# Patient Record
Sex: Female | Born: 1945
Health system: Southern US, Community
[De-identification: ages and names within clinical notes are randomized; demographics above are authoritative.]

## PROBLEM LIST (undated history)

## (undated) DIAGNOSIS — E039 Hypothyroidism, unspecified: Secondary | ICD-10-CM

## (undated) DIAGNOSIS — R269 Unspecified abnormalities of gait and mobility: Secondary | ICD-10-CM

## (undated) DIAGNOSIS — F419 Anxiety disorder, unspecified: Secondary | ICD-10-CM

## (undated) DIAGNOSIS — N390 Urinary tract infection, site not specified: Secondary | ICD-10-CM

## (undated) DIAGNOSIS — I6529 Occlusion and stenosis of unspecified carotid artery: Secondary | ICD-10-CM

## (undated) DIAGNOSIS — I351 Nonrheumatic aortic (valve) insufficiency: Secondary | ICD-10-CM

## (undated) DIAGNOSIS — N952 Postmenopausal atrophic vaginitis: Secondary | ICD-10-CM

## (undated) DIAGNOSIS — L409 Psoriasis, unspecified: Secondary | ICD-10-CM

## (undated) DIAGNOSIS — R400 Somnolence: Secondary | ICD-10-CM

## (undated) DIAGNOSIS — I10 Essential (primary) hypertension: Secondary | ICD-10-CM

## (undated) DIAGNOSIS — E785 Hyperlipidemia, unspecified: Secondary | ICD-10-CM

## (undated) DIAGNOSIS — H9190 Unspecified hearing loss, unspecified ear: Secondary | ICD-10-CM

## (undated) DIAGNOSIS — J329 Chronic sinusitis, unspecified: Secondary | ICD-10-CM

## (undated) HISTORY — DX: Anxiety disorder, unspecified: F41.9

## (undated) HISTORY — DX: Chronic sinusitis, unspecified: J32.9

## (undated) HISTORY — DX: Hyperlipidemia, unspecified: E78.5

## (undated) HISTORY — DX: Hypothyroidism, unspecified: E03.9

## (undated) HISTORY — DX: Unspecified hearing loss, unspecified ear: H91.90

## (undated) HISTORY — DX: Occlusion and stenosis of unspecified carotid artery: I65.29

## (undated) HISTORY — DX: Unspecified abnormalities of gait and mobility: R26.9

## (undated) HISTORY — DX: Somnolence: R40.0

## (undated) HISTORY — DX: Urinary tract infection, site not specified: N39.0

## (undated) HISTORY — DX: Nonrheumatic aortic (valve) insufficiency: I35.1

## (undated) HISTORY — DX: Postmenopausal atrophic vaginitis: N95.2

## (undated) HISTORY — DX: Psoriasis, unspecified: L40.9

## (undated) HISTORY — DX: Essential (primary) hypertension: I10

## (undated) HISTORY — PX: TONSILLECTOMY: SUR1361

---

## 1991-11-06 HISTORY — PX: CHOLECYSTECTOMY: SHX55

## 2015-11-28 DIAGNOSIS — J019 Acute sinusitis, unspecified: Secondary | ICD-10-CM | POA: Diagnosis not present

## 2016-04-25 DIAGNOSIS — Z1231 Encounter for screening mammogram for malignant neoplasm of breast: Secondary | ICD-10-CM | POA: Diagnosis not present

## 2016-04-25 DIAGNOSIS — I10 Essential (primary) hypertension: Secondary | ICD-10-CM | POA: Diagnosis not present

## 2016-04-25 DIAGNOSIS — Z1389 Encounter for screening for other disorder: Secondary | ICD-10-CM | POA: Diagnosis not present

## 2016-04-25 DIAGNOSIS — Z130 Encounter for screening for diseases of the blood and blood-forming organs and certain disorders involving the immune mechanism: Secondary | ICD-10-CM | POA: Diagnosis not present

## 2016-04-25 DIAGNOSIS — E038 Other specified hypothyroidism: Secondary | ICD-10-CM | POA: Diagnosis not present

## 2016-04-25 DIAGNOSIS — Z0000 Encounter for general adult medical examination without abnormal findings: Secondary | ICD-10-CM | POA: Diagnosis not present

## 2016-04-25 DIAGNOSIS — Z6820 Body mass index (BMI) 20.0-20.9, adult: Secondary | ICD-10-CM | POA: Diagnosis not present

## 2016-04-25 DIAGNOSIS — Z1211 Encounter for screening for malignant neoplasm of colon: Secondary | ICD-10-CM | POA: Diagnosis not present

## 2016-04-25 DIAGNOSIS — E349 Endocrine disorder, unspecified: Secondary | ICD-10-CM | POA: Diagnosis not present

## 2016-04-25 DIAGNOSIS — Z79899 Other long term (current) drug therapy: Secondary | ICD-10-CM | POA: Diagnosis not present

## 2016-04-25 DIAGNOSIS — Z1212 Encounter for screening for malignant neoplasm of rectum: Secondary | ICD-10-CM | POA: Diagnosis not present

## 2016-04-25 DIAGNOSIS — E7800 Pure hypercholesterolemia, unspecified: Secondary | ICD-10-CM | POA: Diagnosis not present

## 2016-04-25 DIAGNOSIS — Z01419 Encounter for gynecological examination (general) (routine) without abnormal findings: Secondary | ICD-10-CM | POA: Diagnosis not present

## 2016-04-25 DIAGNOSIS — Z13820 Encounter for screening for osteoporosis: Secondary | ICD-10-CM | POA: Diagnosis not present

## 2016-04-25 DIAGNOSIS — Z124 Encounter for screening for malignant neoplasm of cervix: Secondary | ICD-10-CM | POA: Diagnosis not present

## 2016-04-25 DIAGNOSIS — Z789 Other specified health status: Secondary | ICD-10-CM | POA: Diagnosis not present

## 2016-05-01 ENCOUNTER — Other Ambulatory Visit (HOSPITAL_BASED_OUTPATIENT_CLINIC_OR_DEPARTMENT_OTHER): Payer: Self-pay | Admitting: Obstetrics and Gynecology

## 2016-05-01 DIAGNOSIS — Z780 Asymptomatic menopausal state: Secondary | ICD-10-CM

## 2016-05-01 DIAGNOSIS — Z1231 Encounter for screening mammogram for malignant neoplasm of breast: Secondary | ICD-10-CM

## 2016-05-03 ENCOUNTER — Ambulatory Visit (HOSPITAL_BASED_OUTPATIENT_CLINIC_OR_DEPARTMENT_OTHER)
Admission: RE | Admit: 2016-05-03 | Discharge: 2016-05-03 | Disposition: A | Discharge status: Home / Self Care | Payer: Medicare Other | Source: Ambulatory Visit | Attending: Obstetrics and Gynecology | Admitting: Obstetrics and Gynecology

## 2016-05-03 ENCOUNTER — Other Ambulatory Visit (HOSPITAL_BASED_OUTPATIENT_CLINIC_OR_DEPARTMENT_OTHER): Payer: Self-pay | Admitting: Obstetrics and Gynecology

## 2016-05-03 DIAGNOSIS — Z13820 Encounter for screening for osteoporosis: Secondary | ICD-10-CM

## 2016-05-03 DIAGNOSIS — Z780 Asymptomatic menopausal state: Secondary | ICD-10-CM | POA: Diagnosis not present

## 2016-05-03 DIAGNOSIS — M85851 Other specified disorders of bone density and structure, right thigh: Secondary | ICD-10-CM | POA: Insufficient documentation

## 2016-05-03 DIAGNOSIS — Z1231 Encounter for screening mammogram for malignant neoplasm of breast: Secondary | ICD-10-CM

## 2016-05-03 DIAGNOSIS — Z1239 Encounter for other screening for malignant neoplasm of breast: Secondary | ICD-10-CM | POA: Diagnosis not present

## 2016-05-03 IMAGING — MG MM DIGITAL SCREENING BILAT W/ TOMO W/ CAD
8 series · 9 of 24 positions shown · non-contrast
Comparison: Previous exam(s).

CLINICAL DATA: Screening.

EXAM:
2D DIGITAL SCREENING BILATERAL MAMMOGRAM WITH CAD AND ADJUNCT TOMO

[R CC]
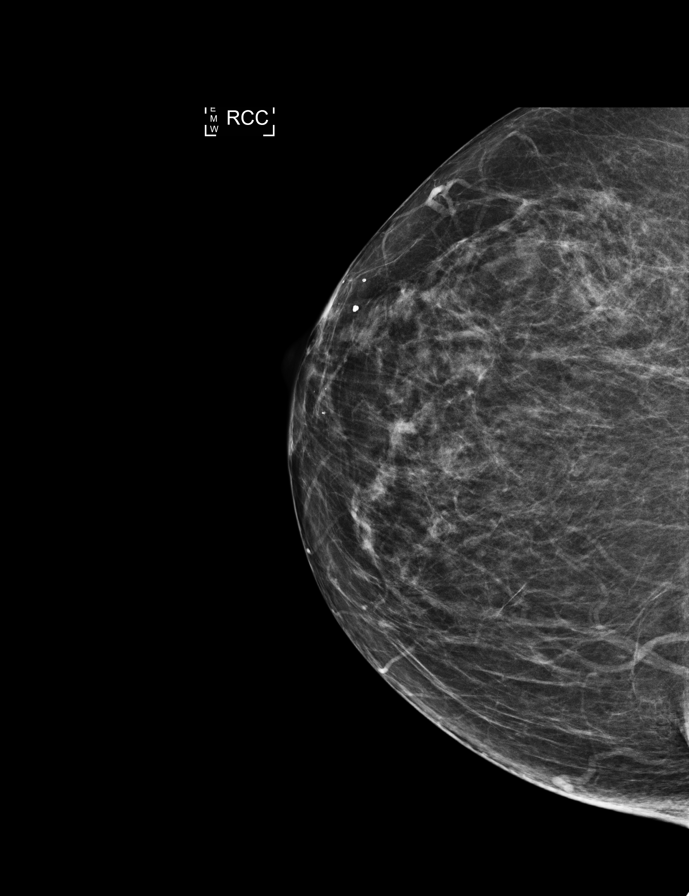

[R MLO]
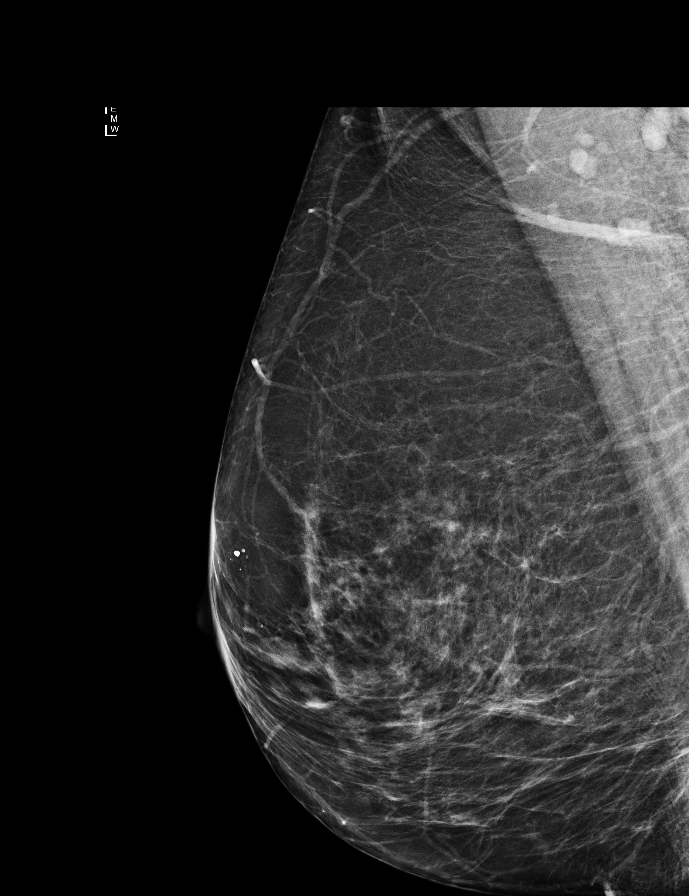

[L CC]
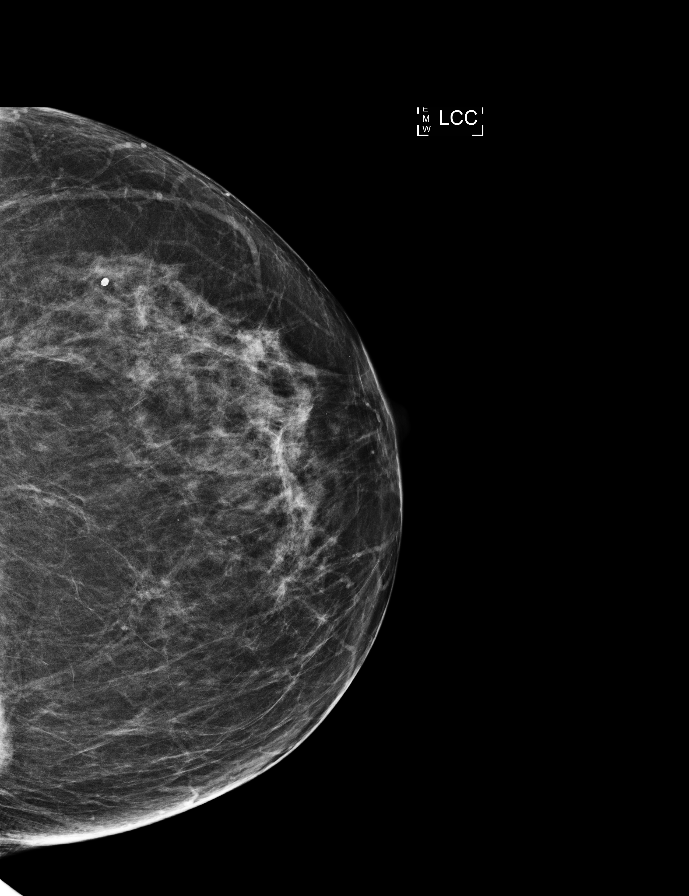

[L MLO]
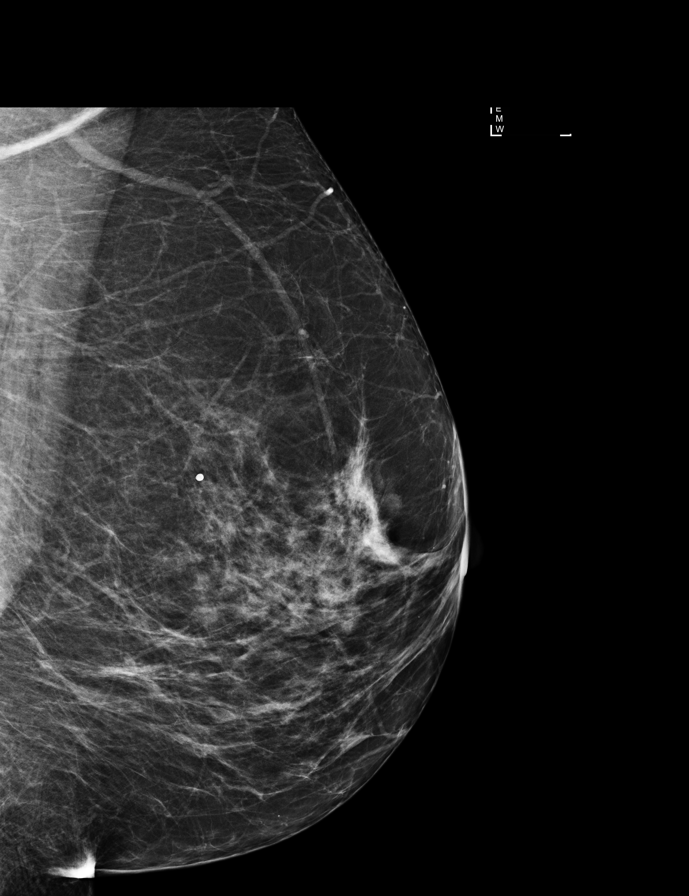

[L MLO tomo · 2 of 72 frames shown]
[frame 24/72]
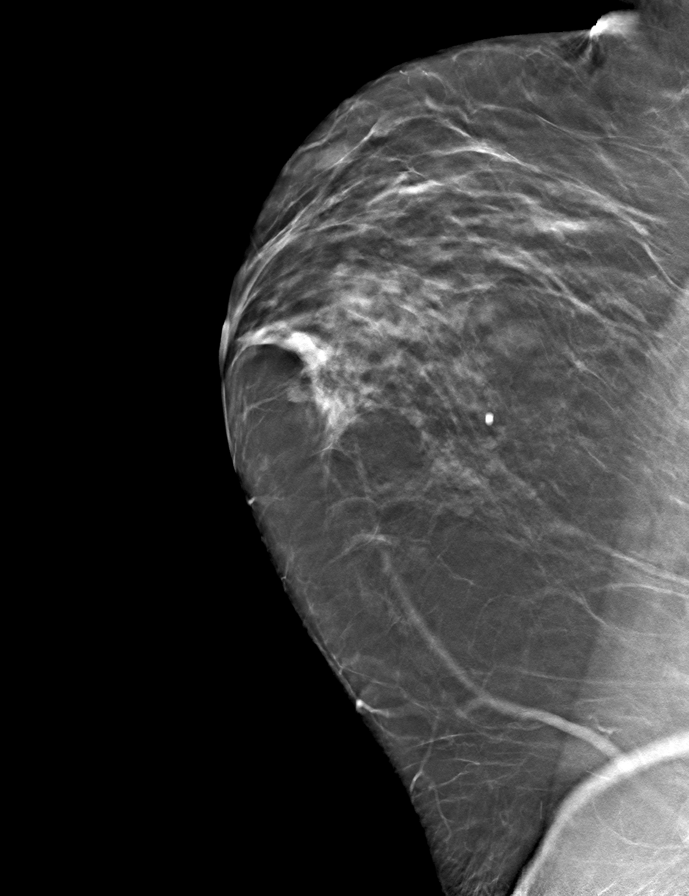
[frame 37/72]
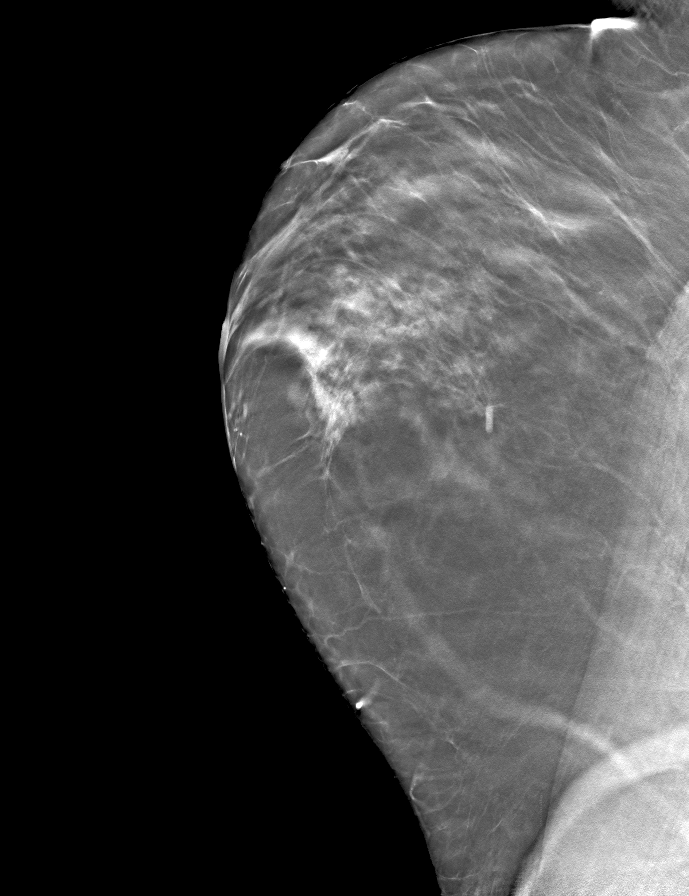

[L CC tomo · tomo slice 37/74.0]
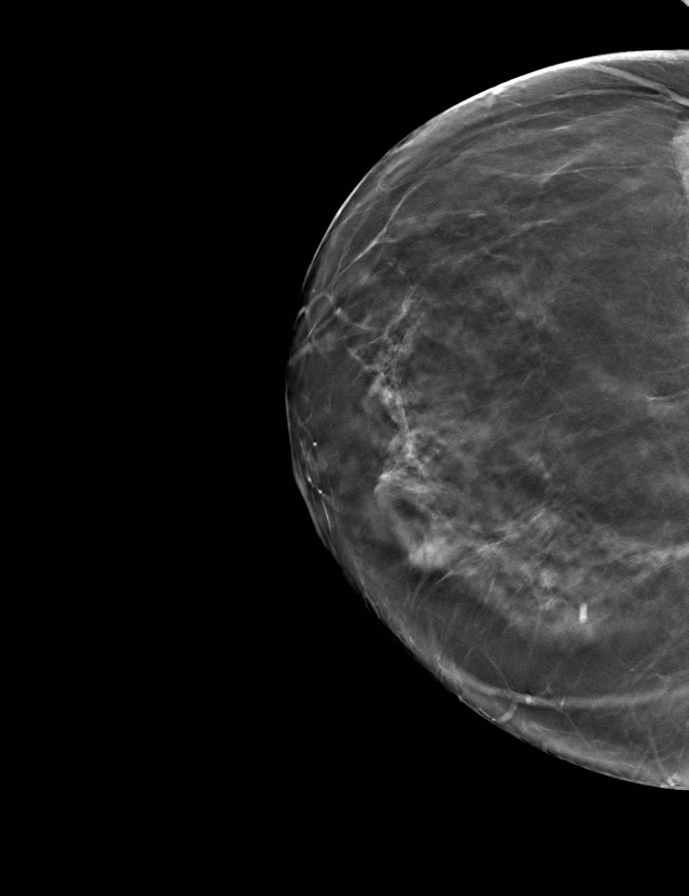

[R MLO tomo · tomo slice 37/73.0]
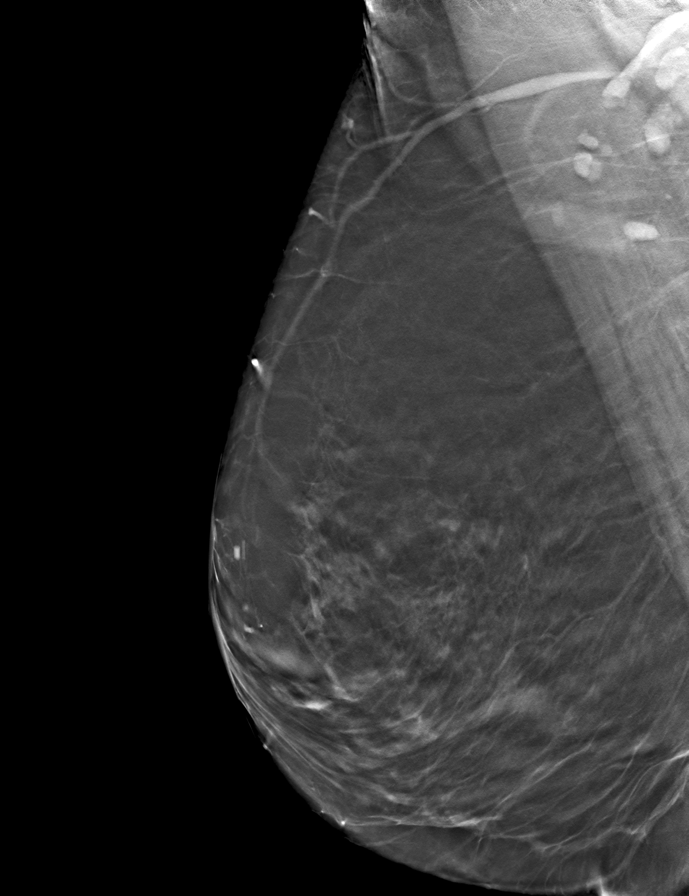

[R CC tomo · tomo slice 35/68.0]
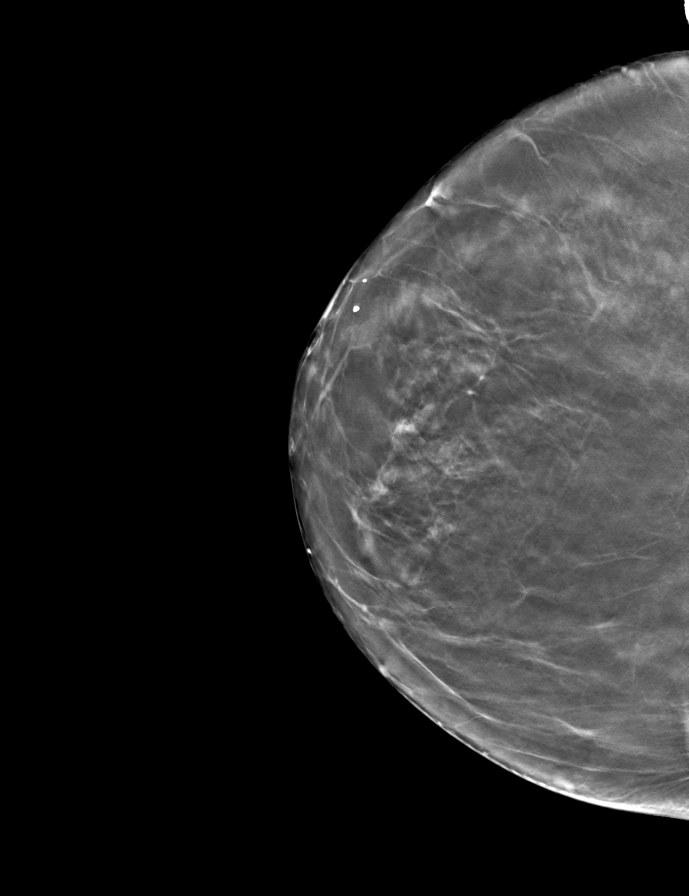

[9 of 24 positions shown; findings below may reference images not displayed]

ACR Breast Density Category b: There are scattered areas of
fibroglandular density.
FINDINGS: There are no findings suspicious for malignancy. Images were
processed with CAD.
IMPRESSION: No mammographic evidence of malignancy. A result letter of this
screening mammogram will be mailed directly to the patient.

RECOMMENDATION:
Screening mammogram in one year. (Code:[33])

BI-RADS CATEGORY  1: Negative.

## 2016-05-28 DIAGNOSIS — D6859 Other primary thrombophilia: Secondary | ICD-10-CM | POA: Diagnosis not present

## 2016-05-28 DIAGNOSIS — Z79899 Other long term (current) drug therapy: Secondary | ICD-10-CM | POA: Diagnosis not present

## 2016-05-28 DIAGNOSIS — N959 Unspecified menopausal and perimenopausal disorder: Secondary | ICD-10-CM | POA: Diagnosis not present

## 2016-05-28 DIAGNOSIS — E7800 Pure hypercholesterolemia, unspecified: Secondary | ICD-10-CM | POA: Diagnosis not present

## 2016-05-28 DIAGNOSIS — E038 Other specified hypothyroidism: Secondary | ICD-10-CM | POA: Diagnosis not present

## 2016-05-28 DIAGNOSIS — Z724 Inappropriate diet and eating habits: Secondary | ICD-10-CM | POA: Diagnosis not present

## 2016-05-28 DIAGNOSIS — E630 Essential fatty acid [EFA] deficiency: Secondary | ICD-10-CM | POA: Diagnosis not present

## 2016-05-28 DIAGNOSIS — Z789 Other specified health status: Secondary | ICD-10-CM | POA: Diagnosis not present

## 2016-05-28 DIAGNOSIS — R7301 Impaired fasting glucose: Secondary | ICD-10-CM | POA: Diagnosis not present

## 2016-05-28 DIAGNOSIS — Z6820 Body mass index (BMI) 20.0-20.9, adult: Secondary | ICD-10-CM | POA: Diagnosis not present

## 2016-05-31 DIAGNOSIS — R5382 Chronic fatigue, unspecified: Secondary | ICD-10-CM | POA: Diagnosis not present

## 2016-06-02 DIAGNOSIS — R1084 Generalized abdominal pain: Secondary | ICD-10-CM | POA: Diagnosis not present

## 2016-06-19 DIAGNOSIS — Z724 Inappropriate diet and eating habits: Secondary | ICD-10-CM | POA: Diagnosis not present

## 2016-06-19 DIAGNOSIS — Z6820 Body mass index (BMI) 20.0-20.9, adult: Secondary | ICD-10-CM | POA: Diagnosis not present

## 2016-06-19 DIAGNOSIS — E7800 Pure hypercholesterolemia, unspecified: Secondary | ICD-10-CM | POA: Diagnosis not present

## 2016-06-19 DIAGNOSIS — E630 Essential fatty acid [EFA] deficiency: Secondary | ICD-10-CM | POA: Diagnosis not present

## 2016-06-19 DIAGNOSIS — R7301 Impaired fasting glucose: Secondary | ICD-10-CM | POA: Diagnosis not present

## 2016-06-19 DIAGNOSIS — I6529 Occlusion and stenosis of unspecified carotid artery: Secondary | ICD-10-CM | POA: Diagnosis not present

## 2016-06-19 DIAGNOSIS — Z789 Other specified health status: Secondary | ICD-10-CM | POA: Diagnosis not present

## 2016-08-03 DIAGNOSIS — E630 Essential fatty acid [EFA] deficiency: Secondary | ICD-10-CM | POA: Diagnosis not present

## 2016-08-03 DIAGNOSIS — Z789 Other specified health status: Secondary | ICD-10-CM | POA: Diagnosis not present

## 2016-08-03 DIAGNOSIS — Z6820 Body mass index (BMI) 20.0-20.9, adult: Secondary | ICD-10-CM | POA: Diagnosis not present

## 2016-08-03 DIAGNOSIS — E7800 Pure hypercholesterolemia, unspecified: Secondary | ICD-10-CM | POA: Diagnosis not present

## 2016-08-03 DIAGNOSIS — R7301 Impaired fasting glucose: Secondary | ICD-10-CM | POA: Diagnosis not present

## 2016-08-03 DIAGNOSIS — Z724 Inappropriate diet and eating habits: Secondary | ICD-10-CM | POA: Diagnosis not present

## 2016-08-03 DIAGNOSIS — E230 Hypopituitarism: Secondary | ICD-10-CM | POA: Diagnosis not present

## 2016-08-03 DIAGNOSIS — B3782 Candidal enteritis: Secondary | ICD-10-CM | POA: Diagnosis not present

## 2016-08-16 DIAGNOSIS — Z23 Encounter for immunization: Secondary | ICD-10-CM | POA: Diagnosis not present

## 2016-09-10 DIAGNOSIS — F419 Anxiety disorder, unspecified: Secondary | ICD-10-CM | POA: Diagnosis not present

## 2016-09-10 DIAGNOSIS — J329 Chronic sinusitis, unspecified: Secondary | ICD-10-CM | POA: Diagnosis not present

## 2016-10-25 DIAGNOSIS — D6859 Other primary thrombophilia: Secondary | ICD-10-CM | POA: Diagnosis not present

## 2016-10-25 DIAGNOSIS — R109 Unspecified abdominal pain: Secondary | ICD-10-CM | POA: Diagnosis not present

## 2016-10-25 DIAGNOSIS — I10 Essential (primary) hypertension: Secondary | ICD-10-CM | POA: Diagnosis not present

## 2016-10-25 DIAGNOSIS — J209 Acute bronchitis, unspecified: Secondary | ICD-10-CM | POA: Diagnosis not present

## 2016-10-25 DIAGNOSIS — Z1389 Encounter for screening for other disorder: Secondary | ICD-10-CM | POA: Diagnosis not present

## 2016-10-25 DIAGNOSIS — Z6820 Body mass index (BMI) 20.0-20.9, adult: Secondary | ICD-10-CM | POA: Diagnosis not present

## 2016-10-25 DIAGNOSIS — R7301 Impaired fasting glucose: Secondary | ICD-10-CM | POA: Diagnosis not present

## 2016-10-25 DIAGNOSIS — K580 Irritable bowel syndrome with diarrhea: Secondary | ICD-10-CM | POA: Diagnosis not present

## 2016-10-25 DIAGNOSIS — E7800 Pure hypercholesterolemia, unspecified: Secondary | ICD-10-CM | POA: Diagnosis not present

## 2016-11-26 DIAGNOSIS — M545 Low back pain: Secondary | ICD-10-CM | POA: Diagnosis not present

## 2017-02-04 DIAGNOSIS — E7800 Pure hypercholesterolemia, unspecified: Secondary | ICD-10-CM | POA: Diagnosis not present

## 2017-02-04 DIAGNOSIS — Z6820 Body mass index (BMI) 20.0-20.9, adult: Secondary | ICD-10-CM | POA: Diagnosis not present

## 2017-02-04 DIAGNOSIS — I10 Essential (primary) hypertension: Secondary | ICD-10-CM | POA: Diagnosis not present

## 2017-04-29 DIAGNOSIS — H2513 Age-related nuclear cataract, bilateral: Secondary | ICD-10-CM | POA: Diagnosis not present

## 2017-04-29 DIAGNOSIS — L718 Other rosacea: Secondary | ICD-10-CM | POA: Diagnosis not present

## 2017-04-29 DIAGNOSIS — H16223 Keratoconjunctivitis sicca, not specified as Sjogren's, bilateral: Secondary | ICD-10-CM | POA: Diagnosis not present

## 2017-04-29 DIAGNOSIS — L719 Rosacea, unspecified: Secondary | ICD-10-CM | POA: Diagnosis not present

## 2017-05-06 DIAGNOSIS — H01021 Squamous blepharitis right upper eyelid: Secondary | ICD-10-CM | POA: Diagnosis not present

## 2017-05-06 DIAGNOSIS — H40013 Open angle with borderline findings, low risk, bilateral: Secondary | ICD-10-CM | POA: Diagnosis not present

## 2017-05-06 DIAGNOSIS — H2589 Other age-related cataract: Secondary | ICD-10-CM | POA: Diagnosis not present

## 2017-05-06 DIAGNOSIS — H01022 Squamous blepharitis right lower eyelid: Secondary | ICD-10-CM | POA: Diagnosis not present

## 2017-05-06 DIAGNOSIS — H2513 Age-related nuclear cataract, bilateral: Secondary | ICD-10-CM | POA: Diagnosis not present

## 2017-05-06 DIAGNOSIS — H01024 Squamous blepharitis left upper eyelid: Secondary | ICD-10-CM | POA: Diagnosis not present

## 2017-05-06 DIAGNOSIS — H04123 Dry eye syndrome of bilateral lacrimal glands: Secondary | ICD-10-CM | POA: Diagnosis not present

## 2017-05-06 DIAGNOSIS — H01025 Squamous blepharitis left lower eyelid: Secondary | ICD-10-CM | POA: Diagnosis not present

## 2017-05-14 DIAGNOSIS — E782 Mixed hyperlipidemia: Secondary | ICD-10-CM | POA: Diagnosis not present

## 2017-05-14 DIAGNOSIS — E038 Other specified hypothyroidism: Secondary | ICD-10-CM | POA: Diagnosis not present

## 2017-05-14 DIAGNOSIS — Z8249 Family history of ischemic heart disease and other diseases of the circulatory system: Secondary | ICD-10-CM | POA: Diagnosis not present

## 2017-05-14 DIAGNOSIS — R5383 Other fatigue: Secondary | ICD-10-CM | POA: Diagnosis not present

## 2017-05-14 DIAGNOSIS — E039 Hypothyroidism, unspecified: Secondary | ICD-10-CM | POA: Diagnosis not present

## 2017-05-14 DIAGNOSIS — E559 Vitamin D deficiency, unspecified: Secondary | ICD-10-CM | POA: Diagnosis not present

## 2017-05-14 DIAGNOSIS — E7211 Homocystinuria: Secondary | ICD-10-CM | POA: Diagnosis not present

## 2017-05-14 DIAGNOSIS — Z6824 Body mass index (BMI) 24.0-24.9, adult: Secondary | ICD-10-CM | POA: Diagnosis not present

## 2017-05-14 DIAGNOSIS — E7800 Pure hypercholesterolemia, unspecified: Secondary | ICD-10-CM | POA: Diagnosis not present

## 2017-05-14 DIAGNOSIS — I10 Essential (primary) hypertension: Secondary | ICD-10-CM | POA: Diagnosis not present

## 2017-05-20 DIAGNOSIS — H2512 Age-related nuclear cataract, left eye: Secondary | ICD-10-CM | POA: Diagnosis not present

## 2017-05-29 DIAGNOSIS — H2511 Age-related nuclear cataract, right eye: Secondary | ICD-10-CM | POA: Diagnosis not present

## 2017-06-03 DIAGNOSIS — H2512 Age-related nuclear cataract, left eye: Secondary | ICD-10-CM | POA: Diagnosis not present

## 2017-06-03 DIAGNOSIS — H2511 Age-related nuclear cataract, right eye: Secondary | ICD-10-CM | POA: Diagnosis not present

## 2017-07-07 DIAGNOSIS — Z23 Encounter for immunization: Secondary | ICD-10-CM | POA: Diagnosis not present

## 2017-07-12 DIAGNOSIS — E785 Hyperlipidemia, unspecified: Secondary | ICD-10-CM | POA: Diagnosis not present

## 2017-07-12 DIAGNOSIS — F419 Anxiety disorder, unspecified: Secondary | ICD-10-CM | POA: Diagnosis not present

## 2017-07-12 DIAGNOSIS — E039 Hypothyroidism, unspecified: Secondary | ICD-10-CM | POA: Diagnosis not present

## 2017-07-12 DIAGNOSIS — I10 Essential (primary) hypertension: Secondary | ICD-10-CM | POA: Diagnosis not present

## 2017-08-16 DIAGNOSIS — E785 Hyperlipidemia, unspecified: Secondary | ICD-10-CM | POA: Diagnosis not present

## 2017-08-16 DIAGNOSIS — F419 Anxiety disorder, unspecified: Secondary | ICD-10-CM | POA: Diagnosis not present

## 2017-08-16 DIAGNOSIS — E039 Hypothyroidism, unspecified: Secondary | ICD-10-CM | POA: Diagnosis not present

## 2017-08-16 DIAGNOSIS — I10 Essential (primary) hypertension: Secondary | ICD-10-CM | POA: Diagnosis not present

## 2017-08-26 DIAGNOSIS — L853 Xerosis cutis: Secondary | ICD-10-CM | POA: Diagnosis not present

## 2017-08-26 DIAGNOSIS — E039 Hypothyroidism, unspecified: Secondary | ICD-10-CM | POA: Diagnosis not present

## 2017-08-26 DIAGNOSIS — E785 Hyperlipidemia, unspecified: Secondary | ICD-10-CM | POA: Diagnosis not present

## 2017-08-26 DIAGNOSIS — I10 Essential (primary) hypertension: Secondary | ICD-10-CM | POA: Diagnosis not present

## 2017-10-11 DIAGNOSIS — E039 Hypothyroidism, unspecified: Secondary | ICD-10-CM | POA: Diagnosis not present

## 2017-10-16 DIAGNOSIS — I10 Essential (primary) hypertension: Secondary | ICD-10-CM | POA: Diagnosis not present

## 2017-10-16 DIAGNOSIS — E785 Hyperlipidemia, unspecified: Secondary | ICD-10-CM | POA: Diagnosis not present

## 2017-10-16 DIAGNOSIS — E039 Hypothyroidism, unspecified: Secondary | ICD-10-CM | POA: Diagnosis not present

## 2017-10-16 DIAGNOSIS — F419 Anxiety disorder, unspecified: Secondary | ICD-10-CM | POA: Diagnosis not present

## 2018-01-21 DIAGNOSIS — H9190 Unspecified hearing loss, unspecified ear: Secondary | ICD-10-CM | POA: Diagnosis not present

## 2018-01-21 DIAGNOSIS — R002 Palpitations: Secondary | ICD-10-CM | POA: Diagnosis not present

## 2018-01-21 DIAGNOSIS — F419 Anxiety disorder, unspecified: Secondary | ICD-10-CM | POA: Diagnosis not present

## 2018-01-21 DIAGNOSIS — I10 Essential (primary) hypertension: Secondary | ICD-10-CM | POA: Diagnosis not present

## 2018-01-21 DIAGNOSIS — E039 Hypothyroidism, unspecified: Secondary | ICD-10-CM | POA: Diagnosis not present

## 2018-01-21 DIAGNOSIS — Z13820 Encounter for screening for osteoporosis: Secondary | ICD-10-CM | POA: Diagnosis not present

## 2018-01-21 DIAGNOSIS — Z1211 Encounter for screening for malignant neoplasm of colon: Secondary | ICD-10-CM | POA: Diagnosis not present

## 2018-01-21 DIAGNOSIS — E785 Hyperlipidemia, unspecified: Secondary | ICD-10-CM | POA: Diagnosis not present

## 2018-01-21 DIAGNOSIS — Z0000 Encounter for general adult medical examination without abnormal findings: Secondary | ICD-10-CM | POA: Diagnosis not present

## 2018-01-25 DIAGNOSIS — Z1211 Encounter for screening for malignant neoplasm of colon: Secondary | ICD-10-CM | POA: Diagnosis not present

## 2018-02-03 DIAGNOSIS — M8589 Other specified disorders of bone density and structure, multiple sites: Secondary | ICD-10-CM | POA: Diagnosis not present

## 2018-02-03 DIAGNOSIS — Z780 Asymptomatic menopausal state: Secondary | ICD-10-CM | POA: Diagnosis not present

## 2018-02-03 DIAGNOSIS — Z1231 Encounter for screening mammogram for malignant neoplasm of breast: Secondary | ICD-10-CM | POA: Diagnosis not present

## 2018-02-10 DIAGNOSIS — E039 Hypothyroidism, unspecified: Secondary | ICD-10-CM | POA: Diagnosis not present

## 2018-02-10 DIAGNOSIS — R002 Palpitations: Secondary | ICD-10-CM | POA: Diagnosis not present

## 2018-02-10 DIAGNOSIS — E785 Hyperlipidemia, unspecified: Secondary | ICD-10-CM | POA: Diagnosis not present

## 2018-02-10 DIAGNOSIS — I10 Essential (primary) hypertension: Secondary | ICD-10-CM | POA: Diagnosis not present

## 2018-02-11 DIAGNOSIS — E785 Hyperlipidemia, unspecified: Secondary | ICD-10-CM | POA: Diagnosis not present

## 2018-02-11 DIAGNOSIS — R002 Palpitations: Secondary | ICD-10-CM | POA: Diagnosis not present

## 2018-02-11 DIAGNOSIS — E039 Hypothyroidism, unspecified: Secondary | ICD-10-CM | POA: Diagnosis not present

## 2018-02-11 DIAGNOSIS — I10 Essential (primary) hypertension: Secondary | ICD-10-CM | POA: Diagnosis not present

## 2018-02-14 DIAGNOSIS — I10 Essential (primary) hypertension: Secondary | ICD-10-CM | POA: Diagnosis not present

## 2018-03-13 DIAGNOSIS — E039 Hypothyroidism, unspecified: Secondary | ICD-10-CM | POA: Diagnosis not present

## 2018-03-13 DIAGNOSIS — E785 Hyperlipidemia, unspecified: Secondary | ICD-10-CM | POA: Diagnosis not present

## 2018-03-13 DIAGNOSIS — I10 Essential (primary) hypertension: Secondary | ICD-10-CM | POA: Diagnosis not present

## 2018-03-13 DIAGNOSIS — R002 Palpitations: Secondary | ICD-10-CM | POA: Diagnosis not present

## 2018-04-02 DIAGNOSIS — H903 Sensorineural hearing loss, bilateral: Secondary | ICD-10-CM | POA: Diagnosis not present

## 2018-05-05 DIAGNOSIS — M19071 Primary osteoarthritis, right ankle and foot: Secondary | ICD-10-CM | POA: Diagnosis not present

## 2018-05-05 DIAGNOSIS — G5761 Lesion of plantar nerve, right lower limb: Secondary | ICD-10-CM | POA: Diagnosis not present

## 2018-05-05 DIAGNOSIS — M79671 Pain in right foot: Secondary | ICD-10-CM | POA: Diagnosis not present

## 2018-05-05 DIAGNOSIS — M25571 Pain in right ankle and joints of right foot: Secondary | ICD-10-CM | POA: Diagnosis not present

## 2018-05-22 DIAGNOSIS — M25571 Pain in right ankle and joints of right foot: Secondary | ICD-10-CM | POA: Diagnosis not present

## 2018-05-22 DIAGNOSIS — G5761 Lesion of plantar nerve, right lower limb: Secondary | ICD-10-CM | POA: Diagnosis not present

## 2018-07-02 DIAGNOSIS — M25571 Pain in right ankle and joints of right foot: Secondary | ICD-10-CM | POA: Diagnosis not present

## 2018-07-02 DIAGNOSIS — G5761 Lesion of plantar nerve, right lower limb: Secondary | ICD-10-CM | POA: Diagnosis not present

## 2018-07-23 DIAGNOSIS — Z23 Encounter for immunization: Secondary | ICD-10-CM | POA: Diagnosis not present

## 2018-09-04 DIAGNOSIS — R8290 Unspecified abnormal findings in urine: Secondary | ICD-10-CM | POA: Diagnosis not present

## 2018-09-04 DIAGNOSIS — R400 Somnolence: Secondary | ICD-10-CM | POA: Diagnosis not present

## 2018-09-04 DIAGNOSIS — E785 Hyperlipidemia, unspecified: Secondary | ICD-10-CM | POA: Diagnosis not present

## 2018-09-04 DIAGNOSIS — L409 Psoriasis, unspecified: Secondary | ICD-10-CM | POA: Diagnosis not present

## 2018-09-04 DIAGNOSIS — F419 Anxiety disorder, unspecified: Secondary | ICD-10-CM | POA: Diagnosis not present

## 2018-09-04 DIAGNOSIS — R5383 Other fatigue: Secondary | ICD-10-CM | POA: Diagnosis not present

## 2018-09-04 DIAGNOSIS — R0602 Shortness of breath: Secondary | ICD-10-CM | POA: Diagnosis not present

## 2018-09-17 DIAGNOSIS — E785 Hyperlipidemia, unspecified: Secondary | ICD-10-CM | POA: Diagnosis not present

## 2018-09-17 DIAGNOSIS — Z6824 Body mass index (BMI) 24.0-24.9, adult: Secondary | ICD-10-CM | POA: Diagnosis not present

## 2018-09-17 DIAGNOSIS — E039 Hypothyroidism, unspecified: Secondary | ICD-10-CM | POA: Diagnosis not present

## 2018-09-17 DIAGNOSIS — I10 Essential (primary) hypertension: Secondary | ICD-10-CM | POA: Diagnosis not present

## 2018-10-22 DIAGNOSIS — E039 Hypothyroidism, unspecified: Secondary | ICD-10-CM | POA: Diagnosis not present

## 2018-11-04 DIAGNOSIS — E785 Hyperlipidemia, unspecified: Secondary | ICD-10-CM | POA: Diagnosis not present

## 2018-11-04 DIAGNOSIS — E039 Hypothyroidism, unspecified: Secondary | ICD-10-CM | POA: Diagnosis not present

## 2018-11-04 DIAGNOSIS — L659 Nonscarring hair loss, unspecified: Secondary | ICD-10-CM | POA: Diagnosis not present

## 2018-11-04 DIAGNOSIS — Z6824 Body mass index (BMI) 24.0-24.9, adult: Secondary | ICD-10-CM | POA: Diagnosis not present

## 2018-11-04 DIAGNOSIS — I10 Essential (primary) hypertension: Secondary | ICD-10-CM | POA: Diagnosis not present

## 2018-11-20 DIAGNOSIS — R351 Nocturia: Secondary | ICD-10-CM | POA: Diagnosis not present

## 2018-11-20 DIAGNOSIS — R350 Frequency of micturition: Secondary | ICD-10-CM | POA: Diagnosis not present

## 2018-11-20 DIAGNOSIS — N3946 Mixed incontinence: Secondary | ICD-10-CM | POA: Diagnosis not present

## 2018-11-25 DIAGNOSIS — I10 Essential (primary) hypertension: Secondary | ICD-10-CM | POA: Diagnosis not present

## 2018-11-26 DIAGNOSIS — N3020 Other chronic cystitis without hematuria: Secondary | ICD-10-CM | POA: Diagnosis not present

## 2018-11-26 DIAGNOSIS — N390 Urinary tract infection, site not specified: Secondary | ICD-10-CM | POA: Diagnosis not present

## 2018-11-26 DIAGNOSIS — B9620 Unspecified Escherichia coli [E. coli] as the cause of diseases classified elsewhere: Secondary | ICD-10-CM | POA: Diagnosis not present

## 2019-01-01 DIAGNOSIS — E039 Hypothyroidism, unspecified: Secondary | ICD-10-CM | POA: Diagnosis not present

## 2019-03-02 DIAGNOSIS — F419 Anxiety disorder, unspecified: Secondary | ICD-10-CM | POA: Diagnosis not present

## 2019-03-02 DIAGNOSIS — E039 Hypothyroidism, unspecified: Secondary | ICD-10-CM | POA: Diagnosis not present

## 2019-03-02 DIAGNOSIS — N952 Postmenopausal atrophic vaginitis: Secondary | ICD-10-CM | POA: Diagnosis not present

## 2019-03-02 DIAGNOSIS — I10 Essential (primary) hypertension: Secondary | ICD-10-CM | POA: Diagnosis not present

## 2019-03-02 DIAGNOSIS — E785 Hyperlipidemia, unspecified: Secondary | ICD-10-CM | POA: Diagnosis not present

## 2019-04-09 DIAGNOSIS — R5383 Other fatigue: Secondary | ICD-10-CM | POA: Diagnosis not present

## 2019-04-09 DIAGNOSIS — R269 Unspecified abnormalities of gait and mobility: Secondary | ICD-10-CM | POA: Diagnosis not present

## 2019-04-21 ENCOUNTER — Other Ambulatory Visit: Payer: Self-pay | Admitting: Family Medicine

## 2019-04-21 DIAGNOSIS — R269 Unspecified abnormalities of gait and mobility: Secondary | ICD-10-CM

## 2019-04-21 DIAGNOSIS — R413 Other amnesia: Secondary | ICD-10-CM

## 2019-05-05 ENCOUNTER — Encounter: Payer: Self-pay | Admitting: *Deleted

## 2019-05-05 DIAGNOSIS — E785 Hyperlipidemia, unspecified: Secondary | ICD-10-CM | POA: Diagnosis not present

## 2019-05-05 DIAGNOSIS — L659 Nonscarring hair loss, unspecified: Secondary | ICD-10-CM | POA: Diagnosis not present

## 2019-05-05 DIAGNOSIS — Z6824 Body mass index (BMI) 24.0-24.9, adult: Secondary | ICD-10-CM | POA: Diagnosis not present

## 2019-05-05 DIAGNOSIS — E039 Hypothyroidism, unspecified: Secondary | ICD-10-CM | POA: Diagnosis not present

## 2019-05-05 DIAGNOSIS — I10 Essential (primary) hypertension: Secondary | ICD-10-CM | POA: Diagnosis not present

## 2019-05-06 ENCOUNTER — Encounter: Payer: Self-pay | Admitting: Neurology

## 2019-05-06 ENCOUNTER — Ambulatory Visit (INDEPENDENT_AMBULATORY_CARE_PROVIDER_SITE_OTHER): Payer: Medicare Other | Admitting: Neurology

## 2019-05-06 ENCOUNTER — Other Ambulatory Visit: Payer: Self-pay

## 2019-05-06 ENCOUNTER — Telehealth: Payer: Self-pay | Admitting: Neurology

## 2019-05-06 VITALS — BP 150/80 | HR 72 | Temp 95.9°F | Ht 63.5 in | Wt 138.0 lb

## 2019-05-06 DIAGNOSIS — R5383 Other fatigue: Secondary | ICD-10-CM | POA: Diagnosis not present

## 2019-05-06 DIAGNOSIS — Z79899 Other long term (current) drug therapy: Secondary | ICD-10-CM | POA: Diagnosis not present

## 2019-05-06 DIAGNOSIS — R4189 Other symptoms and signs involving cognitive functions and awareness: Secondary | ICD-10-CM | POA: Diagnosis not present

## 2019-05-06 DIAGNOSIS — E531 Pyridoxine deficiency: Secondary | ICD-10-CM

## 2019-05-06 DIAGNOSIS — Z114 Encounter for screening for human immunodeficiency virus [HIV]: Secondary | ICD-10-CM

## 2019-05-06 DIAGNOSIS — R4701 Aphasia: Secondary | ICD-10-CM

## 2019-05-06 DIAGNOSIS — E519 Thiamine deficiency, unspecified: Secondary | ICD-10-CM | POA: Diagnosis not present

## 2019-05-06 DIAGNOSIS — G9349 Other encephalopathy: Secondary | ICD-10-CM | POA: Diagnosis not present

## 2019-05-06 DIAGNOSIS — R413 Other amnesia: Secondary | ICD-10-CM

## 2019-05-06 DIAGNOSIS — B999 Unspecified infectious disease: Secondary | ICD-10-CM

## 2019-05-06 DIAGNOSIS — E538 Deficiency of other specified B group vitamins: Secondary | ICD-10-CM | POA: Diagnosis not present

## 2019-05-06 DIAGNOSIS — R410 Disorientation, unspecified: Secondary | ICD-10-CM

## 2019-05-06 DIAGNOSIS — E539 Vitamin B deficiency, unspecified: Secondary | ICD-10-CM | POA: Diagnosis not present

## 2019-05-06 DIAGNOSIS — R799 Abnormal finding of blood chemistry, unspecified: Secondary | ICD-10-CM | POA: Diagnosis not present
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
## 2019-05-06 NOTE — Patient Instructions (Addendum)
MRI of the brain w/wo contrast Labwork  Formal memory testing  Memory Compensation Strategies  1. Use "WARM" strategy.  W= write it down  A= associate it  R= repeat it  M= make a mental note  2.   You can keep a Social worker.  Use a 3-ring notebook with sections for the following: calendar, important names and phone numbers,  medications, doctors' names/phone numbers, lists/reminders, and a section to journal what you did  each day.   3.    Use a calendar to write appointments down.  4.    Write yourself a schedule for the day.  This can be placed on the calendar or in a separate section of the Memory Notebook.  Keeping a  regular schedule can help memory.  5.    Use medication organizer with sections for each day or morning/evening pills.  You may need help loading it  6.    Keep a basket, or pegboard by the door.  Place items that you need to take out with you in the basket or on the pegboard.  You may also want to  include a message board for reminders.  7.    Use sticky notes.  Place sticky notes with reminders in a place where the task is performed.  For example: " turn off the  stove" placed by the stove, "lock the door" placed on the door at eye level, " take your medications" on  the bathroom mirror or by the place where you normally take your medications.  8.    Use alarms/timers.  Use while cooking to remind yourself to check on food or as a reminder to take your medicine, or as a  reminder to make a call, or as a reminder to perform another task, etc.

## 2019-05-06 NOTE — Telephone Encounter (Signed)
[place]ic[place] order sent to [facility]. No auth they will reach out to the patient to schedule.  

## 2019-05-10 LAB — CBC
Hematocrit: 41.9 % (ref 34.0–46.6)
Hemoglobin: 14.7 g/dL (ref 11.1–15.9)
MCH: 31.3 pg (ref 26.6–33.0)
MCHC: 35.1 g/dL (ref 31.5–35.7)
MCV: 89 fL (ref 79–97)
Platelets: 266 10*3/uL (ref 150–450)
RBC: 4.69 x10E6/uL (ref 3.77–5.28)
RDW: 13.4 % (ref 11.7–15.4)
WBC: 9.9 10*3/uL (ref 3.4–10.8)

## 2019-05-10 LAB — COMPREHENSIVE METABOLIC PANEL
ALT: 29 IU/L (ref 0–32)
AST: 38 IU/L (ref 0–40)
Albumin/Globulin Ratio: 1.8 (ref 1.2–2.2)
Albumin: 4.5 g/dL (ref 3.7–4.7)
Alkaline Phosphatase: 99 IU/L (ref 39–117)
BUN/Creatinine Ratio: 28 (ref 12–28)
BUN: 23 mg/dL (ref 8–27)
Bilirubin Total: 0.9 mg/dL (ref 0.0–1.2)
CO2: 21 mmol/L (ref 20–29)
Calcium: 9.7 mg/dL (ref 8.7–10.3)
Chloride: 103 mmol/L (ref 96–106)
Creatinine, Ser: 0.81 mg/dL (ref 0.57–1.00)
GFR calc Af Amer: 85 mL/min/{1.73_m2} (ref >59)
GFR calc non Af Amer: 73 mL/min/{1.73_m2} (ref >59)
Globulin, Total: 2.5 g/dL (ref 1.5–4.5)
Glucose: 97 mg/dL (ref 65–99)
Potassium: 4.2 mmol/L (ref 3.5–5.2)
Sodium: 139 mmol/L (ref 134–144)
Total Protein: 7 g/dL (ref 6.0–8.5)

## 2019-05-10 LAB — METHYLMALONIC ACID, SERUM: Methylmalonic Acid: 204 nmol/L (ref 0–378)

## 2019-05-10 LAB — RPR: RPR Ser Ql: NONREACTIVE

## 2019-05-10 LAB — HIV ANTIBODY (ROUTINE TESTING W REFLEX): HIV Screen 4th Generation wRfx: NONREACTIVE

## 2019-05-10 LAB — VITAMIN B6: Vitamin B6: 37 ug/L — ABNORMAL HIGH (ref 2.0–32.8)

## 2019-05-10 LAB — VITAMIN B1: Thiamine: 187.3 nmol/L (ref 66.5–200.0)

## 2019-05-10 LAB — B12 AND FOLATE PANEL
Folate: >20 ng/mL (ref >3.0)
Vitamin B-12: 537 pg/mL (ref 232–1245)

## 2019-05-10 LAB — HOMOCYSTEINE: Homocysteine: 16 umol/L (ref 0.0–19.2)

## 2019-05-13 ENCOUNTER — Telehealth: Payer: Self-pay | Admitting: Neurology

## 2019-05-13 NOTE — Telephone Encounter (Signed)
-----   Message from Melvenia Beam, MD sent at 05/12/2019  7:06 PM EDT ----- Labs normal thanks

## 2019-05-13 NOTE — Telephone Encounter (Signed)
Called the pt and made her aware of the normal lab findings. Pt verbalized understanding. Pt had no questions at this time but was encouraged to call back if questions arise.

## 2019-05-15 DIAGNOSIS — N3946 Mixed incontinence: Secondary | ICD-10-CM | POA: Diagnosis not present

## 2019-05-15 DIAGNOSIS — R350 Frequency of micturition: Secondary | ICD-10-CM | POA: Diagnosis not present

## 2019-05-27 ENCOUNTER — Other Ambulatory Visit: Payer: Medicare Other

## 2019-05-29 DIAGNOSIS — E039 Hypothyroidism, unspecified: Secondary | ICD-10-CM | POA: Diagnosis not present

## 2019-06-02 DIAGNOSIS — I10 Essential (primary) hypertension: Secondary | ICD-10-CM | POA: Diagnosis not present

## 2019-06-02 DIAGNOSIS — L659 Nonscarring hair loss, unspecified: Secondary | ICD-10-CM | POA: Diagnosis not present

## 2019-06-02 DIAGNOSIS — E785 Hyperlipidemia, unspecified: Secondary | ICD-10-CM | POA: Diagnosis not present

## 2019-06-02 DIAGNOSIS — Z6824 Body mass index (BMI) 24.0-24.9, adult: Secondary | ICD-10-CM | POA: Diagnosis not present

## 2019-06-02 DIAGNOSIS — E039 Hypothyroidism, unspecified: Secondary | ICD-10-CM | POA: Diagnosis not present

## 2019-06-03 ENCOUNTER — Ambulatory Visit
Admission: RE | Admit: 2019-06-03 | Discharge: 2019-06-03 | Disposition: A | Discharge status: Home / Self Care | Payer: Medicare Other | Source: Ambulatory Visit | Attending: Neurology | Admitting: Neurology

## 2019-06-03 ENCOUNTER — Other Ambulatory Visit: Payer: Self-pay

## 2019-06-03 DIAGNOSIS — R4701 Aphasia: Secondary | ICD-10-CM | POA: Diagnosis not present

## 2019-06-03 DIAGNOSIS — R410 Disorientation, unspecified: Secondary | ICD-10-CM

## 2019-06-03 DIAGNOSIS — R413 Other amnesia: Secondary | ICD-10-CM

## 2019-06-03 MED ORDER — GADOBENATE DIMEGLUMINE 529 MG/ML IV SOLN
14.0000 mL | Freq: Once | INTRAVENOUS | Status: AC | PRN
  Administered 2019-06-03: 14 mL via INTRAVENOUS

## 2019-06-08 ENCOUNTER — Telehealth: Payer: Self-pay | Admitting: *Deleted

## 2019-06-08 NOTE — Telephone Encounter (Signed)
-----   Message from Melvenia Beam, MD sent at 06/05/2019  9:49 AM EDT ----- Brain normal for age, thanks

## 2019-06-08 NOTE — Telephone Encounter (Signed)
Spoke with pt and discussed MRI brain results being normal for age. Her questions were answered. She verbalized appreciation for the call.

## 2019-07-08 DIAGNOSIS — Z23 Encounter for immunization: Secondary | ICD-10-CM | POA: Diagnosis not present

## 2019-07-17 DIAGNOSIS — E039 Hypothyroidism, unspecified: Secondary | ICD-10-CM | POA: Diagnosis not present

## 2019-07-17 DIAGNOSIS — L659 Nonscarring hair loss, unspecified: Secondary | ICD-10-CM | POA: Diagnosis not present

## 2019-07-27 DIAGNOSIS — E039 Hypothyroidism, unspecified: Secondary | ICD-10-CM | POA: Diagnosis not present

## 2019-07-27 DIAGNOSIS — L659 Nonscarring hair loss, unspecified: Secondary | ICD-10-CM | POA: Diagnosis not present

## 2019-07-27 DIAGNOSIS — Z6824 Body mass index (BMI) 24.0-24.9, adult: Secondary | ICD-10-CM | POA: Diagnosis not present

## 2019-07-27 DIAGNOSIS — E785 Hyperlipidemia, unspecified: Secondary | ICD-10-CM | POA: Diagnosis not present

## 2019-07-27 DIAGNOSIS — I10 Essential (primary) hypertension: Secondary | ICD-10-CM | POA: Diagnosis not present

## 2019-08-11 ENCOUNTER — Encounter: Payer: Self-pay | Admitting: Psychology

## 2019-08-11 ENCOUNTER — Other Ambulatory Visit: Payer: Self-pay

## 2019-08-11 ENCOUNTER — Encounter: Payer: Medicare Other | Attending: Psychology | Admitting: Psychology

## 2019-08-11 DIAGNOSIS — R413 Other amnesia: Secondary | ICD-10-CM | POA: Insufficient documentation

## 2019-08-11 DIAGNOSIS — G4759 Other parasomnia: Secondary | ICD-10-CM | POA: Diagnosis not present

## 2019-08-11 DIAGNOSIS — F22 Delusional disorders: Secondary | ICD-10-CM | POA: Insufficient documentation

## 2019-08-11 DIAGNOSIS — F600 Paranoid personality disorder: Secondary | ICD-10-CM
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
## 2019-08-11 NOTE — Progress Notes (Signed)
Made any corrections needed, and agree with history, physical, neuro exam,assessment and plan as stated.     Antonia Ahern, MD [facility]  

## 2019-08-24 ENCOUNTER — Encounter: Payer: Self-pay | Admitting: Psychology

## 2019-08-24 ENCOUNTER — Other Ambulatory Visit: Payer: Self-pay

## 2019-08-24 ENCOUNTER — Encounter: Payer: Medicare Other | Admitting: Psychology

## 2019-08-24 DIAGNOSIS — F22 Delusional disorders: Secondary | ICD-10-CM | POA: Diagnosis not present

## 2019-08-24 DIAGNOSIS — R413 Other amnesia: Secondary | ICD-10-CM | POA: Diagnosis not present

## 2019-08-24 DIAGNOSIS — F600 Paranoid personality disorder: Secondary | ICD-10-CM | POA: Diagnosis not present

## 2019-08-24 DIAGNOSIS — G4759 Other parasomnia: Secondary | ICD-10-CM | POA: Diagnosis not present

## 2019-08-24 NOTE — Progress Notes (Signed)
The patient arrived on time to her 8:00 testing appointment. The evaluation lasted 240 minutes.  Mental Status & Behavioral Observations:  Appearance: Casually dressed, appropriately groomed.  Gait: Ambulated independently without difficulty. Speech:  Clear, normal rate, tone, & volume; Mild WFD Noted Thought process: Disorganized, tangential, and perseverative at times. Significantly abnormal with paranoid and delusional ideation noted.   Mood & Affect: Anxious with broad affect Interpersonal: Mostly pleasant, somewhat disinhibited and inappropriate at times.  Orientation: o x 4   Tests Administered:  Modified [employer] (M-WCST)   Trail Making Test (A&B)  Wechsler Adult Intelligence Scale-Fourth Edition (WAIS-IV)  Wechsler Memory Scale-Fourth Edition (WMS-IV) Older Adult Version (ages 74-90)  She had no difficulty hearing, seeing, or understanding most test items and did not require much additional prompting. She exhibited adequate distress tolerance on questions she did not know or tasks that were more difficult. She became frustrated during a measure of executive function (e.g. problem solving, cognitive flexibility, etc.) that required her to shift strategies in response to changing environmental contingencies. Overall, she was cooperative with all assigned tasks and persisted well throughout the evaluation.    Results:   WAIS-IV  Composite Score Summary  Scale Sum of Scaled Scores Composite Score Percentile Rank 95% Conf. Interval  Verbal Comprehension 31 VCI 102 55 96-108  Perceptual Reasoning 30 PRI 100 50 94-106  Working Memory 23 WMI 108 70 101-114  Processing Speed 21 PSI 102 55 93-110  Full Scale 105 FSIQ 103 58 99-107  General Ability 61 GAI 101 53 96-106   Index Level Discrepancy Comparisons  Comparison Score 1 Score 2 Difference Critical Value .05 Significant Difference Y/N Base Rate by Overall Sample  VCI - PRI 102 100 2 8.31 N 44.7  VCI  - WMI 102 108 -6 9.30 N 33.0  VCI - PSI 102 102 0 10.19 N   PRI - WMI 100 108 -8 10.18 N 28.9  PRI - PSI 100 102 -2 11.00 N 46.8  WMI - PSI 108 102 6 11.76 N 34.0  FSIQ - GAI 103 101 2 3.61 N 38.4   WMS-IV (Older Adult Battery) Index Score Summary  Index Sum of Scaled Scores Index Score Percentile Rank 95% Confidence Interval Qualitative Descriptor  Auditory Memory (AMI) 55 123 94 116-128 Superior  Visual Memory (VMI) 26 116 86 111-120 High Average  Immediate Memory (IMI) 39 120 91 113-125 Superior  Delayed Memory (DMI) 42 127 96 117-132 Superior   Primary Subtest Scaled Score Summary  Subtest Domain Raw Score Scaled Score Percentile Rank  Logical Memory I AM 41 14 91  Logical Memory II AM 27 14 91  Verbal Paired Associates I AM 30 14 91  Verbal [facility] Associates II AM 8 13 84  Visual Reproduction I VM 32 11 63  Visual Reproduction II VM 32 15 95  Symbol Span VWM 19 11 63   Auditory Memory Process Score Summary  Process Score Raw Score Scaled Score Percentile Rank Cumulative Percentage (Base Rate)  LM II Recognition 21 - - >75%  VPA II Recognition 30 - - >75%   Visual Memory Process Score Summary  Process Score Raw Score Scaled Score Percentile Rank Cumulative Percentage (Base Rate)  VR II Recognition 6 - - >75%    ABILITY-MEMORY ANALYSIS  Ability Score:  GAI: 101 Date of Testing:  WAIS-IV; WMS-IV 2019/08/24  Predicted Difference Method   Index Predicted WMS-IV Index Score Actual WMS-IV Index Score Difference Critical Value  Significant Difference Y/N  Base Rate  Auditory Memory 101 123 -22 10.41 Y 4-5%  Visual Memory 101 116 -15 7.89 Y 10-15%  Immediate Memory 101 120 -19 9.97 Y 5%  Delayed Memory 101 127 -26 12.33 Y 2%  Statistical significance (critical value) at the .01 level.   Contrast Scaled Scores  Score Score 1 Score 2 Contrast Scaled Score  General Ability Index vs. Auditory Memory Index 101 123 15  General Ability Index vs. Visual Memory Index  101 116 14  General Ability Index vs. Immediate Memory Index 101 120 15  General Ability Index vs. Delayed Memory Index 101 127 17  Verbal Comprehension Index vs. Auditory Memory Index 102 123 15  Perceptual Reasoning Index vs. Visual Memory Index 100 116 13  Working Memory Index vs. Auditory Memory Index 108 123 15    Trail Making Test: . Part A (Time=31", Errors=0, T=53, 62nd %) . Part B (Time=197", Errors=2, T=33, 5th %)  M-WCST:  . # Categories Correct (Raw=2, T=28, 1st %) . # Perseverative Errors (Raw=15, T=30, 2nd %) . # Total Errors  (Raw=29, T=30, 2nd %) . % Perseverative Responses (52%, T=38, 12th %) . Executive Function Composite (SS=49, <1st %)

## 2019-09-17 DIAGNOSIS — Z0000 Encounter for general adult medical examination without abnormal findings: Secondary | ICD-10-CM | POA: Diagnosis not present

## 2019-09-17 DIAGNOSIS — I10 Essential (primary) hypertension: Secondary | ICD-10-CM | POA: Diagnosis not present

## 2019-09-17 DIAGNOSIS — Z1211 Encounter for screening for malignant neoplasm of colon: Secondary | ICD-10-CM | POA: Diagnosis not present

## 2019-09-17 DIAGNOSIS — M79604 Pain in right leg: Secondary | ICD-10-CM | POA: Diagnosis not present

## 2019-09-17 DIAGNOSIS — M79605 Pain in left leg: Secondary | ICD-10-CM | POA: Diagnosis not present

## 2019-09-17 DIAGNOSIS — Z1159 Encounter for screening for other viral diseases: Secondary | ICD-10-CM | POA: Diagnosis not present

## 2019-09-17 DIAGNOSIS — E039 Hypothyroidism, unspecified: Secondary | ICD-10-CM | POA: Diagnosis not present

## 2019-09-17 DIAGNOSIS — Z124 Encounter for screening for malignant neoplasm of cervix: Secondary | ICD-10-CM | POA: Diagnosis not present

## 2019-09-17 DIAGNOSIS — E785 Hyperlipidemia, unspecified: Secondary | ICD-10-CM | POA: Diagnosis not present

## 2019-09-17 DIAGNOSIS — F419 Anxiety disorder, unspecified: Secondary | ICD-10-CM | POA: Diagnosis not present

## 2019-09-23 DIAGNOSIS — Z124 Encounter for screening for malignant neoplasm of cervix: Secondary | ICD-10-CM | POA: Diagnosis not present

## 2019-09-23 DIAGNOSIS — N390 Urinary tract infection, site not specified: Secondary | ICD-10-CM | POA: Diagnosis not present

## 2019-09-23 DIAGNOSIS — Z6824 Body mass index (BMI) 24.0-24.9, adult: Secondary | ICD-10-CM | POA: Diagnosis not present

## 2019-09-24 DIAGNOSIS — E039 Hypothyroidism, unspecified: Secondary | ICD-10-CM | POA: Diagnosis not present

## 2019-09-28 DIAGNOSIS — E785 Hyperlipidemia, unspecified: Secondary | ICD-10-CM | POA: Diagnosis not present

## 2019-09-28 DIAGNOSIS — E039 Hypothyroidism, unspecified: Secondary | ICD-10-CM | POA: Diagnosis not present

## 2019-09-28 DIAGNOSIS — I10 Essential (primary) hypertension: Secondary | ICD-10-CM | POA: Diagnosis not present

## 2019-09-28 DIAGNOSIS — L659 Nonscarring hair loss, unspecified: Secondary | ICD-10-CM | POA: Diagnosis not present

## 2019-09-28 DIAGNOSIS — Z6824 Body mass index (BMI) 24.0-24.9, adult: Secondary | ICD-10-CM | POA: Diagnosis not present

## 2019-10-14 DIAGNOSIS — E039 Hypothyroidism, unspecified: Secondary | ICD-10-CM | POA: Diagnosis not present

## 2019-10-16 DIAGNOSIS — Z1159 Encounter for screening for other viral diseases: Secondary | ICD-10-CM | POA: Diagnosis not present

## 2019-10-22 ENCOUNTER — Other Ambulatory Visit: Payer: Self-pay

## 2019-10-22 DIAGNOSIS — I739 Peripheral vascular disease, unspecified: Secondary | ICD-10-CM

## 2019-10-23 ENCOUNTER — Ambulatory Visit (HOSPITAL_COMMUNITY)
Admission: RE | Admit: 2019-10-23 | Discharge: 2019-10-23 | Disposition: A | Discharge status: Home / Self Care | Payer: Medicare Other | Source: Ambulatory Visit | Attending: Vascular Surgery | Admitting: Vascular Surgery

## 2019-10-23 ENCOUNTER — Encounter: Payer: Self-pay | Admitting: Vascular Surgery

## 2019-10-23 ENCOUNTER — Ambulatory Visit (INDEPENDENT_AMBULATORY_CARE_PROVIDER_SITE_OTHER): Payer: Medicare Other | Admitting: Vascular Surgery

## 2019-10-23 ENCOUNTER — Other Ambulatory Visit: Payer: Self-pay

## 2019-10-23 VITALS — BP 118/74 | HR 76 | Temp 97.6°F | Resp 20 | Ht 63.5 in | Wt 143.0 lb

## 2019-10-23 DIAGNOSIS — I739 Peripheral vascular disease, unspecified: Secondary | ICD-10-CM

## 2019-10-23 NOTE — Progress Notes (Signed)
Patient ID: Kristina Swanson, female   DOB: 1946-08-02, 73 y.o.   MRN: [id number]  Reason for Consult: New Patient (Initial Visit)   Referred by Marda Stalker, PA-C  Subjective:     HPI:  Kristina Swanson is a 73 y.o. very pleasant female originally from [place] now living here in [facility].  She has psoriasis which she has dealt with for many years.  She does have bilateral lower extremity mostly weakness.  She states that she has to sit down to put her clothes on given her lack of balance.  She also has developed a tremor in her bilateral upper extremities left greater than right.  She is able to walk does have some weakness in her legs.  She has never had stroke TIA or amaurosis does states she was told she had blocked carotid arteries in the past.  She otherwise does not have any coronary disease.  Other than psoriasis has no wounds on her legs.  Risk factors for vascular disease include hyperlipidemia and hypertension.  She takes a statin does not take any aspirin.  Past Medical History:  Diagnosis Date  . Anxiety   . Aortic insufficiency    mild/mod insufficiency/sclerosis shown on 2013 echo  . Atrophic vaginitis   . Carotid artery stenosis   . Daytime somnolence   . Gait disturbance   . HTN (hypertension), benign   . Hyperlipidemia   . Hypothyroidism   . Perceived hearing changes   . Psoriasis   . Recurrent urinary tract infection    [facility]  . Sinusitis    Family History  Problem Relation Age of Onset  . Memory loss Mother   . Congestive Heart Failure Mother   . Alzheimer's disease Maternal Aunt        in 18s  . Alzheimer's disease Maternal Uncle        in 87s  . Leukemia Father    Past Surgical History:  Procedure Laterality Date  . CHOLECYSTECTOMY  1993  . TONSILLECTOMY     as a child    Short Social History:  Social History   Tobacco Use  . Smoking status: Never Smoker  . Smokeless tobacco: Never Used  Substance Use  Topics  . Alcohol use: Yes    Alcohol/week: 14.0 standard drinks    Types: 14 Glasses of wine per week    Comment: wine     Allergies  Allergen Reactions  . Codeine Hives    Current Outpatient Medications  Medication Sig Dispense Refill  . ALPRAZolam (XANAX) 1 MG tablet Take 0.5 mg by mouth 2 (two) times daily as needed for anxiety.    . Ascorbic Acid (VITAMIN C PO) Take by mouth.    . B Complex Vitamins (VITAMIN B COMPLEX PO) Take by mouth.    . clobetasol cream (TEMOVATE) 9.67 % Apply 1 application topically 2 (two) times daily as needed (psoriasis).    . pravastatin (PRAVACHOL) 40 MG tablet Take 40 mg by mouth 3 (three) times a week.    Marland Kitchen PRESCRIPTION MEDICATION Omega 3    . spironolactone (ALDACTONE) 25 MG tablet Take 25 mg by mouth 2 (two) times daily.     Marland Kitchen SYNTHROID 75 MCG tablet     . valsartan-hydrochlorothiazide (DIOVAN-HCT) 160-25 MG tablet Take 1 tablet by mouth daily.    Marland Kitchen VITAMIN D PO Take by mouth.     No current facility-administered medications for this visit.    Review of Systems  Constitutional:  Constitutional negative. HENT: HENT negative.  Eyes: Eyes negative.  Cardiovascular: Cardiovascular negative.  GI: Gastrointestinal negative.  Musculoskeletal: Musculoskeletal negative.  Skin: Positive for rash.  Neurological: Positive for focal weakness.       Tremor Hematologic: Hematologic/lymphatic negative.  Psychiatric: Psychiatric negative.        Objective:  Objective   Vitals:   10/23/19 0922  BP: 118/74  Pulse: 76  Resp: 20  Temp: 97.6 F (36.4 C)  SpO2: 95%  Weight: 143 lb (64.9 kg)  Height: 5' 3.5" (1.613 m)   Body mass index is 24.93 kg/m.  Physical Exam HENT:     Head: Normocephalic.     Nose: Nose normal.  Eyes:     Pupils: Pupils are equal, round, and reactive to light.  Neck:     Vascular: Carotid bruit present.  Cardiovascular:     Rate and Rhythm: Normal rate.     Pulses:          Radial pulses are 2+ on the right  side and 2+ on the left side.       Posterior tibial pulses are 2+ on the right side and 2+ on the left side.  Pulmonary:     Breath sounds: Normal breath sounds.  Abdominal:     General: Abdomen is flat.     Palpations: Abdomen is soft.  Musculoskeletal:        General: No swelling. Normal range of motion.  Skin:    General: Skin is warm and dry.     Capillary Refill: Capillary refill takes less than 2 seconds.  Neurological:     Mental Status: She is alert.  Psychiatric:        Mood and Affect: Mood normal.        Thought Content: Thought content normal.        Judgment: Judgment normal.     Data: I have independently interpreted her ABIs to be 1.03 triphasic right and 1.02 triphasic right.  Toe pressure right 156 and left 143.     Assessment/Plan:    73 year old female sent for evaluation bilateral lower extremities.  I think that her weakness is unrelated to vascular disease given her reserved ABIs and palpable pulses.  She does have a faint left carotid bruit with previous diagnosis of carotid artery stenosis when she was in [place], [place].  She denies any stroke TIA or amaurosis.  I have asked her to take baby aspirin along with her statin.  She will follow-up in a few months to evaluate her carotid arteries.     Maeola Harman MD [facility]

## 2019-11-09 ENCOUNTER — Other Ambulatory Visit: Payer: Self-pay | Admitting: *Deleted

## 2019-11-09 DIAGNOSIS — I6529 Occlusion and stenosis of unspecified carotid artery: Secondary | ICD-10-CM

## 2019-11-24 ENCOUNTER — Encounter (HOSPITAL_COMMUNITY): Payer: Medicare Other

## 2019-11-24 ENCOUNTER — Encounter: Payer: Medicare Other | Admitting: Vascular Surgery

## 2019-11-26 ENCOUNTER — Other Ambulatory Visit: Payer: Self-pay

## 2019-11-26 ENCOUNTER — Encounter: Payer: Medicare Other | Attending: Psychology | Admitting: Psychology

## 2019-11-26 ENCOUNTER — Encounter: Payer: Self-pay | Admitting: Psychology

## 2019-11-26 DIAGNOSIS — E039 Hypothyroidism, unspecified: Secondary | ICD-10-CM | POA: Diagnosis not present

## 2019-11-26 DIAGNOSIS — G4759 Other parasomnia: Secondary | ICD-10-CM | POA: Diagnosis not present

## 2019-11-26 DIAGNOSIS — F22 Delusional disorders: Secondary | ICD-10-CM | POA: Diagnosis not present

## 2019-11-26 DIAGNOSIS — F600 Paranoid personality disorder: Secondary | ICD-10-CM | POA: Diagnosis not present

## 2019-11-26 DIAGNOSIS — R413 Other amnesia: Secondary | ICD-10-CM | POA: Insufficient documentation
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
## 2019-12-01 ENCOUNTER — Ambulatory Visit: Payer: Medicare Other

## 2019-12-02 DIAGNOSIS — E785 Hyperlipidemia, unspecified: Secondary | ICD-10-CM | POA: Diagnosis not present

## 2019-12-02 DIAGNOSIS — E039 Hypothyroidism, unspecified: Secondary | ICD-10-CM | POA: Diagnosis not present

## 2019-12-02 DIAGNOSIS — Z6824 Body mass index (BMI) 24.0-24.9, adult: Secondary | ICD-10-CM | POA: Diagnosis not present

## 2019-12-02 DIAGNOSIS — L659 Nonscarring hair loss, unspecified: Secondary | ICD-10-CM | POA: Diagnosis not present

## 2019-12-02 DIAGNOSIS — I10 Essential (primary) hypertension: Secondary | ICD-10-CM | POA: Diagnosis not present

## 2019-12-07 ENCOUNTER — Encounter: Payer: Medicare Other | Admitting: Psychology

## 2019-12-09 DIAGNOSIS — Z03818 Encounter for observation for suspected exposure to other biological agents ruled out: Secondary | ICD-10-CM | POA: Diagnosis not present

## 2019-12-09 DIAGNOSIS — J018 Other acute sinusitis: Secondary | ICD-10-CM | POA: Diagnosis not present

## 2019-12-09 DIAGNOSIS — Z20828 Contact with and (suspected) exposure to other viral communicable diseases: Secondary | ICD-10-CM | POA: Diagnosis not present

## 2019-12-09 DIAGNOSIS — I10 Essential (primary) hypertension: Secondary | ICD-10-CM | POA: Diagnosis not present

## 2019-12-12 ENCOUNTER — Ambulatory Visit: Payer: Medicare Other | Attending: Internal Medicine

## 2019-12-12 DIAGNOSIS — Z23 Encounter for immunization: Secondary | ICD-10-CM

## 2019-12-12 NOTE — Progress Notes (Signed)
   Covid-19 Vaccination Clinic  Name:  Kristina Swanson    MRN: [id number] DOB: 05/09/1946  12/12/2019  Kristina Swanson was observed post Covid-19 immunization for 15 minutes without incidence. She was provided with Vaccine Information Sheet and instruction to access the V-Safe system.   Kristina Swanson was instructed to call 911 with any severe reactions post vaccine: Marland Kitchen Difficulty breathing  . Swelling of your face and throat  . A fast heartbeat  . A bad rash all over your body  . Dizziness and weakness    Immunizations Administered    Name Date Dose VIS Date Route   Pfizer COVID-19 Vaccine 12/12/2019  9:27 AM 0.3 mL 10/16/2019 Intramuscular   Manufacturer: [employer]   Lot: [id number]   NDC: [address]-7

## 2019-12-24 ENCOUNTER — Encounter: Payer: Medicare Other | Admitting: Psychology

## 2019-12-31 ENCOUNTER — Other Ambulatory Visit: Payer: Self-pay

## 2019-12-31 ENCOUNTER — Encounter: Payer: Medicare Other | Attending: Psychology | Admitting: Psychology

## 2019-12-31 DIAGNOSIS — G4759 Other parasomnia: Secondary | ICD-10-CM

## 2019-12-31 DIAGNOSIS — F600 Paranoid personality disorder: Secondary | ICD-10-CM

## 2019-12-31 DIAGNOSIS — F22 Delusional disorders: Secondary | ICD-10-CM | POA: Diagnosis not present

## 2019-12-31 DIAGNOSIS — R413 Other amnesia: Secondary | ICD-10-CM | POA: Diagnosis not present

## 2020-01-01 ENCOUNTER — Encounter: Payer: Self-pay | Admitting: Psychology
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
## 2020-01-04 DIAGNOSIS — M8588 Other specified disorders of bone density and structure, other site: Secondary | ICD-10-CM | POA: Diagnosis not present

## 2020-01-04 DIAGNOSIS — Z1231 Encounter for screening mammogram for malignant neoplasm of breast: Secondary | ICD-10-CM | POA: Diagnosis not present

## 2020-01-04 DIAGNOSIS — N958 Other specified menopausal and perimenopausal disorders: Secondary | ICD-10-CM | POA: Diagnosis not present

## 2020-01-04 DIAGNOSIS — M899 Disorder of bone, unspecified: Secondary | ICD-10-CM | POA: Diagnosis not present

## 2020-01-05 ENCOUNTER — Ambulatory Visit: Payer: Medicare Other

## 2020-01-18 ENCOUNTER — Ambulatory Visit: Payer: Medicare Other | Attending: Internal Medicine

## 2020-01-18 DIAGNOSIS — Z23 Encounter for immunization: Secondary | ICD-10-CM

## 2020-01-18 NOTE — Progress Notes (Signed)
   Covid-19 Vaccination Clinic  Name:  Kristina Swanson    MRN: [id number] DOB: 09/17/1946  01/18/2020  Kristina Swanson was observed post Covid-19 immunization for 15 minutes without incident. She was provided with Vaccine Information Sheet and instruction to access the V-Safe system.   Kristina Swanson was instructed to call 911 with any severe reactions post vaccine: Marland Kitchen Difficulty breathing  . Swelling of face and throat  . A fast heartbeat  . A bad rash all over body  . Dizziness and weakness   Immunizations Administered    Name Date Dose VIS Date Route   Pfizer COVID-19 Vaccine 01/18/2020 11:38 AM 0.3 mL 10/16/2019 Intramuscular   Manufacturer: [employer], [employer]   Lot: [id number]   NDC: [address]-8

## 2020-02-15 ENCOUNTER — Ambulatory Visit: Payer: Medicare Other | Admitting: Psychology

## 2020-02-25 DIAGNOSIS — N898 Other specified noninflammatory disorders of vagina: Secondary | ICD-10-CM | POA: Diagnosis not present

## 2020-03-07 DIAGNOSIS — Z79899 Other long term (current) drug therapy: Secondary | ICD-10-CM | POA: Diagnosis not present

## 2020-03-07 DIAGNOSIS — Z6824 Body mass index (BMI) 24.0-24.9, adult: Secondary | ICD-10-CM | POA: Diagnosis not present

## 2020-03-07 DIAGNOSIS — L659 Nonscarring hair loss, unspecified: Secondary | ICD-10-CM | POA: Diagnosis not present

## 2020-03-07 DIAGNOSIS — E039 Hypothyroidism, unspecified: Secondary | ICD-10-CM | POA: Diagnosis not present

## 2020-04-29 DIAGNOSIS — E039 Hypothyroidism, unspecified: Secondary | ICD-10-CM | POA: Diagnosis not present

## 2020-04-29 DIAGNOSIS — I10 Essential (primary) hypertension: Secondary | ICD-10-CM | POA: Diagnosis not present

## 2020-04-29 DIAGNOSIS — E785 Hyperlipidemia, unspecified: Secondary | ICD-10-CM | POA: Diagnosis not present

## 2020-05-25 DIAGNOSIS — E039 Hypothyroidism, unspecified: Secondary | ICD-10-CM | POA: Diagnosis not present

## 2020-05-25 DIAGNOSIS — Z79899 Other long term (current) drug therapy: Secondary | ICD-10-CM | POA: Diagnosis not present

## 2020-05-25 DIAGNOSIS — L659 Nonscarring hair loss, unspecified: Secondary | ICD-10-CM | POA: Diagnosis not present

## 2020-05-26 DIAGNOSIS — E785 Hyperlipidemia, unspecified: Secondary | ICD-10-CM | POA: Diagnosis not present

## 2020-05-26 DIAGNOSIS — I10 Essential (primary) hypertension: Secondary | ICD-10-CM | POA: Diagnosis not present

## 2020-05-26 DIAGNOSIS — E039 Hypothyroidism, unspecified: Secondary | ICD-10-CM | POA: Diagnosis not present

## 2020-06-10 ENCOUNTER — Ambulatory Visit: Payer: Medicare Other

## 2020-06-10 ENCOUNTER — Ambulatory Visit (HOSPITAL_COMMUNITY): Payer: Medicare Other

## 2020-06-24 DIAGNOSIS — E039 Hypothyroidism, unspecified: Secondary | ICD-10-CM | POA: Diagnosis not present

## 2020-06-24 DIAGNOSIS — E785 Hyperlipidemia, unspecified: Secondary | ICD-10-CM | POA: Diagnosis not present

## 2020-06-24 DIAGNOSIS — I10 Essential (primary) hypertension: Secondary | ICD-10-CM | POA: Diagnosis not present

## 2020-06-29 ENCOUNTER — Other Ambulatory Visit: Payer: Self-pay

## 2020-06-29 ENCOUNTER — Ambulatory Visit (INDEPENDENT_AMBULATORY_CARE_PROVIDER_SITE_OTHER): Payer: Medicare Other | Admitting: Physician Assistant

## 2020-06-29 ENCOUNTER — Ambulatory Visit (HOSPITAL_COMMUNITY)
Admission: RE | Admit: 2020-06-29 | Discharge: 2020-06-29 | Disposition: A | Discharge status: Home / Self Care | Payer: Medicare Other | Source: Ambulatory Visit | Attending: Vascular Surgery | Admitting: Vascular Surgery

## 2020-06-29 DIAGNOSIS — R0989 Other specified symptoms and signs involving the circulatory and respiratory systems: Secondary | ICD-10-CM | POA: Diagnosis not present

## 2020-06-29 DIAGNOSIS — I6529 Occlusion and stenosis of unspecified carotid artery: Secondary | ICD-10-CM | POA: Insufficient documentation

## 2020-06-30 ENCOUNTER — Encounter: Payer: Self-pay | Admitting: Physician Assistant

## 2020-06-30 DIAGNOSIS — R0989 Other specified symptoms and signs involving the circulatory and respiratory systems: Secondary | ICD-10-CM | POA: Insufficient documentation

## 2020-06-30 NOTE — Progress Notes (Signed)
Established Carotid Patient   History of Present Illness   Kristina Swanson is a 74 y.o. ([birth year]) female who presents to go over carotid duplex which was performed due to left carotid bruit.  She denies history of CVA or TIA.  She also denies any stroke like symptoms including slurring speech, changes in vision, or one sided weakness.  She is taking an aspirin and statin daily.  She denies tobacco use.  She also denies rest pain or non healing wounds of BLE.  The patient's PMH, PSH, SH, and FamHx were reviewed and are unchanged from prior visit.  Current Outpatient Medications  Medication Sig Dispense Refill  . ALPRAZolam (XANAX) 1 MG tablet Take 0.5 mg by mouth 2 (two) times daily as needed for anxiety.    . Ascorbic Acid (VITAMIN C PO) Take by mouth.    . B Complex Vitamins (VITAMIN B COMPLEX PO) Take by mouth.    . clobetasol cream (TEMOVATE) 0.05 % Apply 1 application topically 2 (two) times daily as needed (psoriasis).    . pravastatin (PRAVACHOL) 40 MG tablet Take 40 mg by mouth 3 (three) times a week.    Marland Kitchen PRESCRIPTION MEDICATION Omega 3    . spironolactone (ALDACTONE) 25 MG tablet Take 25 mg by mouth 2 (two) times daily.     Marland Kitchen SYNTHROID 75 MCG tablet     . VITAMIN D PO Take by mouth.     No current facility-administered medications for this visit.    REVIEW OF SYSTEMS (negative unless checked):   Cardiac:  []  Chest pain or chest pressure? []  Shortness of breath upon activity? []  Shortness of breath when lying flat? []  Irregular heart rhythm?  Vascular:  []  Pain in calf, thigh, or hip brought on by walking? []  Pain in feet at night that wakes you up from your sleep? []  Blood clot in your veins? []  Leg swelling?  Pulmonary:  []  Oxygen at home? []  Productive cough? []  Wheezing?  Neurologic:  []  Sudden weakness in arms or legs? []  Sudden numbness in arms or legs? []  Sudden onset of difficult speaking or slurred speech? []  Temporary loss of vision in one  eye? []  Problems with dizziness?  Gastrointestinal:  []  Blood in stool? []  Vomited blood?  Genitourinary:  []  Burning when urinating? []  Blood in urine?  Psychiatric:  []  Major depression  Hematologic:  []  Bleeding problems? []  Problems with blood clotting?  Dermatologic:  []  Rashes or ulcers?  Constitutional:  []  Fever or chills?  Ear/Nose/Throat:  []  Change in hearing? []  Nose bleeds? []  Sore throat?  Musculoskeletal:  []  Back pain? []  Joint pain? []  Muscle pain?   Physical Examination   Vitals:   06/29/20 1341 06/29/20 1343  BP: (!) 144/81 140/81  Pulse: 77   Resp: 20   Temp: 98.3 F (36.8 C)   TempSrc: Temporal   SpO2: 98%   Weight: 141 lb 12.8 oz (64.3 kg)   Height: 5' 3.5" (1.613 m)    Body mass index is 24.72 kg/m.  General:  WDWN in NAD; vital signs documented above Gait: Not observed HENT: WNL, normocephalic Pulmonary: normal non-labored breathing , without Rales, rhonchi,  wheezing Cardiac: regular HR Abdomen: soft, NT, no masses Skin: without rashes Vascular Exam/Pulses:  Right Left  Radial 2+ (normal) 2+ (normal)  Ulnar absent trace   Extremities: without ischemic changes, without Gangrene , without cellulitis; without open wounds;  Musculoskeletal: no muscle wasting or atrophy  Neurologic: A&O X 3;  No focal weakness or paresthesias are detected Psychiatric:  The pt has Normal affect.  Non-Invasive Vascular Imaging   B Carotid Duplex:   R ICA stenosis:  normal  R VA:  patent and antegrade  L ICA stenosis:  normal  L VA:  patent and antegrade   Medical Decision Making   Kristina Swanson is a 74 y.o. female who presents with carotid duplex due to L carotid bruit   Despite presence of a bruit, the carotid duplex is negative for any hemodynamically significant stenosis of bilateral internal carotid arteries  She can continue to take a baby aspirin daily as previously recommended by Dr. Randie Heinz  Nothing further to add from  a vascular surgery standpoint  Patient may follow up on an as needed basis   Emilie Rutter PA-C [facility] Office: [phone]  Clinic MD: Darrick Penna

## 2020-07-14 DIAGNOSIS — Z23 Encounter for immunization: Secondary | ICD-10-CM | POA: Diagnosis not present

## 2020-07-22 DIAGNOSIS — Z23 Encounter for immunization: Secondary | ICD-10-CM | POA: Diagnosis not present

## 2020-07-26 DIAGNOSIS — E785 Hyperlipidemia, unspecified: Secondary | ICD-10-CM | POA: Diagnosis not present

## 2020-07-26 DIAGNOSIS — E039 Hypothyroidism, unspecified: Secondary | ICD-10-CM | POA: Diagnosis not present

## 2020-07-26 DIAGNOSIS — I10 Essential (primary) hypertension: Secondary | ICD-10-CM | POA: Diagnosis not present

## 2020-08-17 DIAGNOSIS — E785 Hyperlipidemia, unspecified: Secondary | ICD-10-CM | POA: Diagnosis not present

## 2020-08-17 DIAGNOSIS — I10 Essential (primary) hypertension: Secondary | ICD-10-CM | POA: Diagnosis not present

## 2020-08-17 DIAGNOSIS — E039 Hypothyroidism, unspecified: Secondary | ICD-10-CM | POA: Diagnosis not present

## 2020-09-07 DIAGNOSIS — E039 Hypothyroidism, unspecified: Secondary | ICD-10-CM | POA: Diagnosis not present

## 2020-09-12 DIAGNOSIS — L400 Psoriasis vulgaris: Secondary | ICD-10-CM | POA: Diagnosis not present

## 2020-09-12 DIAGNOSIS — L111 Transient acantholytic dermatosis [Grover]: Secondary | ICD-10-CM | POA: Diagnosis not present

## 2020-09-14 DIAGNOSIS — L659 Nonscarring hair loss, unspecified: Secondary | ICD-10-CM | POA: Diagnosis not present

## 2020-09-14 DIAGNOSIS — E785 Hyperlipidemia, unspecified: Secondary | ICD-10-CM | POA: Diagnosis not present

## 2020-09-14 DIAGNOSIS — I10 Essential (primary) hypertension: Secondary | ICD-10-CM | POA: Diagnosis not present

## 2020-09-14 DIAGNOSIS — E039 Hypothyroidism, unspecified: Secondary | ICD-10-CM | POA: Diagnosis not present

## 2020-09-14 DIAGNOSIS — Z6824 Body mass index (BMI) 24.0-24.9, adult: Secondary | ICD-10-CM | POA: Diagnosis not present

## 2020-09-26 DIAGNOSIS — I10 Essential (primary) hypertension: Secondary | ICD-10-CM | POA: Diagnosis not present

## 2020-09-26 DIAGNOSIS — E039 Hypothyroidism, unspecified: Secondary | ICD-10-CM | POA: Diagnosis not present

## 2020-09-26 DIAGNOSIS — E785 Hyperlipidemia, unspecified: Secondary | ICD-10-CM | POA: Diagnosis not present

## 2020-09-26 DIAGNOSIS — M8580 Other specified disorders of bone density and structure, unspecified site: Secondary | ICD-10-CM | POA: Diagnosis not present

## 2020-10-17 DIAGNOSIS — I10 Essential (primary) hypertension: Secondary | ICD-10-CM | POA: Diagnosis not present

## 2020-10-17 DIAGNOSIS — E785 Hyperlipidemia, unspecified: Secondary | ICD-10-CM | POA: Diagnosis not present

## 2020-10-17 DIAGNOSIS — E039 Hypothyroidism, unspecified: Secondary | ICD-10-CM | POA: Diagnosis not present

## 2020-10-17 DIAGNOSIS — M8580 Other specified disorders of bone density and structure, unspecified site: Secondary | ICD-10-CM | POA: Diagnosis not present

## 2020-10-19 DIAGNOSIS — Z01419 Encounter for gynecological examination (general) (routine) without abnormal findings: Secondary | ICD-10-CM | POA: Diagnosis not present

## 2020-10-19 DIAGNOSIS — Z6824 Body mass index (BMI) 24.0-24.9, adult: Secondary | ICD-10-CM | POA: Diagnosis not present

## 2020-11-08 DIAGNOSIS — E785 Hyperlipidemia, unspecified: Secondary | ICD-10-CM | POA: Diagnosis not present

## 2020-11-08 DIAGNOSIS — Z1389 Encounter for screening for other disorder: Secondary | ICD-10-CM | POA: Diagnosis not present

## 2020-11-08 DIAGNOSIS — Z0000 Encounter for general adult medical examination without abnormal findings: Secondary | ICD-10-CM | POA: Diagnosis not present

## 2020-11-22 DIAGNOSIS — I10 Essential (primary) hypertension: Secondary | ICD-10-CM | POA: Diagnosis not present

## 2020-11-22 DIAGNOSIS — E039 Hypothyroidism, unspecified: Secondary | ICD-10-CM | POA: Diagnosis not present

## 2020-11-22 DIAGNOSIS — M8580 Other specified disorders of bone density and structure, unspecified site: Secondary | ICD-10-CM | POA: Diagnosis not present

## 2020-11-22 DIAGNOSIS — E785 Hyperlipidemia, unspecified: Secondary | ICD-10-CM | POA: Diagnosis not present

## 2020-12-05 DIAGNOSIS — E039 Hypothyroidism, unspecified: Secondary | ICD-10-CM | POA: Diagnosis not present

## 2020-12-05 DIAGNOSIS — E785 Hyperlipidemia, unspecified: Secondary | ICD-10-CM | POA: Diagnosis not present

## 2020-12-05 DIAGNOSIS — F419 Anxiety disorder, unspecified: Secondary | ICD-10-CM | POA: Diagnosis not present

## 2020-12-05 DIAGNOSIS — I10 Essential (primary) hypertension: Secondary | ICD-10-CM | POA: Diagnosis not present

## 2020-12-05 DIAGNOSIS — F600 Paranoid personality disorder: Secondary | ICD-10-CM | POA: Diagnosis not present

## 2020-12-05 DIAGNOSIS — M8580 Other specified disorders of bone density and structure, unspecified site: Secondary | ICD-10-CM | POA: Diagnosis not present

## 2020-12-14 DIAGNOSIS — L400 Psoriasis vulgaris: Secondary | ICD-10-CM | POA: Diagnosis not present

## 2020-12-18 DIAGNOSIS — M8580 Other specified disorders of bone density and structure, unspecified site: Secondary | ICD-10-CM | POA: Diagnosis not present

## 2020-12-18 DIAGNOSIS — E039 Hypothyroidism, unspecified: Secondary | ICD-10-CM | POA: Diagnosis not present

## 2020-12-18 DIAGNOSIS — E785 Hyperlipidemia, unspecified: Secondary | ICD-10-CM | POA: Diagnosis not present

## 2020-12-18 DIAGNOSIS — I10 Essential (primary) hypertension: Secondary | ICD-10-CM | POA: Diagnosis not present

## 2021-01-12 DIAGNOSIS — M8580 Other specified disorders of bone density and structure, unspecified site: Secondary | ICD-10-CM | POA: Diagnosis not present

## 2021-01-12 DIAGNOSIS — E039 Hypothyroidism, unspecified: Secondary | ICD-10-CM | POA: Diagnosis not present

## 2021-01-12 DIAGNOSIS — E785 Hyperlipidemia, unspecified: Secondary | ICD-10-CM | POA: Diagnosis not present

## 2021-01-12 DIAGNOSIS — I10 Essential (primary) hypertension: Secondary | ICD-10-CM | POA: Diagnosis not present

## 2021-02-03 DIAGNOSIS — Z1231 Encounter for screening mammogram for malignant neoplasm of breast: Secondary | ICD-10-CM | POA: Diagnosis not present

## 2021-02-27 DIAGNOSIS — Z1211 Encounter for screening for malignant neoplasm of colon: Secondary | ICD-10-CM | POA: Diagnosis not present

## 2021-02-27 DIAGNOSIS — K640 First degree hemorrhoids: Secondary | ICD-10-CM | POA: Diagnosis not present

## 2021-02-27 DIAGNOSIS — K5730 Diverticulosis of large intestine without perforation or abscess without bleeding: Secondary | ICD-10-CM | POA: Diagnosis not present

## 2021-03-07 DIAGNOSIS — I10 Essential (primary) hypertension: Secondary | ICD-10-CM | POA: Diagnosis not present

## 2021-03-07 DIAGNOSIS — L659 Nonscarring hair loss, unspecified: Secondary | ICD-10-CM | POA: Diagnosis not present

## 2021-03-07 DIAGNOSIS — E039 Hypothyroidism, unspecified: Secondary | ICD-10-CM | POA: Diagnosis not present

## 2021-03-09 DIAGNOSIS — Z006 Encounter for examination for normal comparison and control in clinical research program: Secondary | ICD-10-CM | POA: Diagnosis not present

## 2021-03-09 DIAGNOSIS — R748 Abnormal levels of other serum enzymes: Secondary | ICD-10-CM | POA: Diagnosis not present

## 2021-03-14 DIAGNOSIS — E785 Hyperlipidemia, unspecified: Secondary | ICD-10-CM | POA: Diagnosis not present

## 2021-03-14 DIAGNOSIS — I10 Essential (primary) hypertension: Secondary | ICD-10-CM | POA: Diagnosis not present

## 2021-03-14 DIAGNOSIS — Z6824 Body mass index (BMI) 24.0-24.9, adult: Secondary | ICD-10-CM | POA: Diagnosis not present

## 2021-03-14 DIAGNOSIS — L659 Nonscarring hair loss, unspecified: Secondary | ICD-10-CM | POA: Diagnosis not present

## 2021-03-14 DIAGNOSIS — E039 Hypothyroidism, unspecified: Secondary | ICD-10-CM | POA: Diagnosis not present

## 2021-03-23 DIAGNOSIS — E785 Hyperlipidemia, unspecified: Secondary | ICD-10-CM | POA: Diagnosis not present

## 2021-03-23 DIAGNOSIS — I10 Essential (primary) hypertension: Secondary | ICD-10-CM | POA: Diagnosis not present

## 2021-03-23 DIAGNOSIS — E039 Hypothyroidism, unspecified: Secondary | ICD-10-CM | POA: Diagnosis not present

## 2021-03-23 DIAGNOSIS — M8580 Other specified disorders of bone density and structure, unspecified site: Secondary | ICD-10-CM | POA: Diagnosis not present

## 2021-04-17 DIAGNOSIS — I10 Essential (primary) hypertension: Secondary | ICD-10-CM | POA: Diagnosis not present

## 2021-04-17 DIAGNOSIS — M8580 Other specified disorders of bone density and structure, unspecified site: Secondary | ICD-10-CM | POA: Diagnosis not present

## 2021-04-17 DIAGNOSIS — E039 Hypothyroidism, unspecified: Secondary | ICD-10-CM | POA: Diagnosis not present

## 2021-04-17 DIAGNOSIS — E785 Hyperlipidemia, unspecified: Secondary | ICD-10-CM | POA: Diagnosis not present

## 2021-04-20 DIAGNOSIS — E785 Hyperlipidemia, unspecified: Secondary | ICD-10-CM | POA: Diagnosis not present

## 2021-04-20 DIAGNOSIS — F419 Anxiety disorder, unspecified: Secondary | ICD-10-CM | POA: Diagnosis not present

## 2021-04-25 DIAGNOSIS — R21 Rash and other nonspecific skin eruption: Secondary | ICD-10-CM | POA: Diagnosis not present

## 2021-04-27 DIAGNOSIS — S92524A Nondisplaced fracture of medial phalanx of right lesser toe(s), initial encounter for closed fracture: Secondary | ICD-10-CM | POA: Diagnosis not present

## 2021-05-18 DIAGNOSIS — S92524D Nondisplaced fracture of medial phalanx of right lesser toe(s), subsequent encounter for fracture with routine healing: Secondary | ICD-10-CM | POA: Diagnosis not present

## 2021-05-23 DIAGNOSIS — E039 Hypothyroidism, unspecified: Secondary | ICD-10-CM | POA: Diagnosis not present

## 2021-05-23 DIAGNOSIS — I10 Essential (primary) hypertension: Secondary | ICD-10-CM | POA: Diagnosis not present

## 2021-05-23 DIAGNOSIS — M8580 Other specified disorders of bone density and structure, unspecified site: Secondary | ICD-10-CM | POA: Diagnosis not present

## 2021-05-23 DIAGNOSIS — E785 Hyperlipidemia, unspecified: Secondary | ICD-10-CM | POA: Diagnosis not present

## 2021-06-14 DIAGNOSIS — S92524D Nondisplaced fracture of medial phalanx of right lesser toe(s), subsequent encounter for fracture with routine healing: Secondary | ICD-10-CM | POA: Diagnosis not present

## 2021-06-14 DIAGNOSIS — M25571 Pain in right ankle and joints of right foot: Secondary | ICD-10-CM | POA: Diagnosis not present

## 2021-06-14 DIAGNOSIS — G5761 Lesion of plantar nerve, right lower limb: Secondary | ICD-10-CM | POA: Diagnosis not present

## 2021-06-21 ENCOUNTER — Other Ambulatory Visit: Payer: Self-pay | Admitting: Internal Medicine

## 2021-06-21 DIAGNOSIS — Z006 Encounter for examination for normal comparison and control in clinical research program: Secondary | ICD-10-CM

## 2021-07-03 ENCOUNTER — Other Ambulatory Visit: Payer: Self-pay

## 2021-07-03 ENCOUNTER — Ambulatory Visit
Admission: RE | Admit: 2021-07-03 | Discharge: 2021-07-03 | Disposition: A | Discharge status: Home / Self Care | Payer: No Typology Code available for payment source | Source: Ambulatory Visit | Attending: Internal Medicine | Admitting: Internal Medicine

## 2021-07-03 DIAGNOSIS — Z006 Encounter for examination for normal comparison and control in clinical research program: Secondary | ICD-10-CM

## 2021-07-03 IMAGING — MR MR HEAD W/O CM
7 series · 48 of 48 positions shown · non-contrast
Comparison: [DATE]

CLINICAL DATA: Exam for clinical research

EXAM:
MRI HEAD WITHOUT CONTRAST
TECHNIQUE: Multiplanar, multiecho pulse sequences of the brain and surrounding
structures were obtained without intravenous contrast.

[Series 2: 3dt1 sag · sagittal · 1.2mm · 1.25mm/px · 18 of 172 slices shown]
[im 1/172]
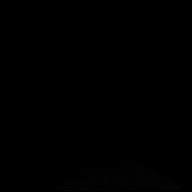
[im 11/172]
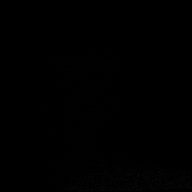
[im 21/172]
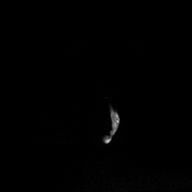
[im 31/172]
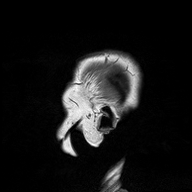
[im 41/172]
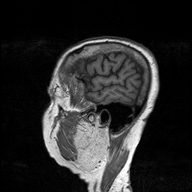
[im 51/172]
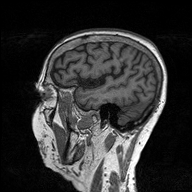
[im 61/172]
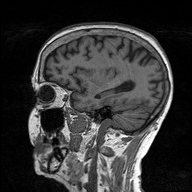
[im 71/172]
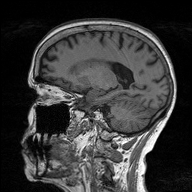
[im 81/172]
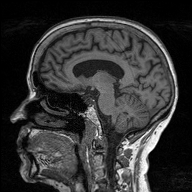
[im 91/172]
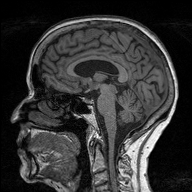
[im 101/172]
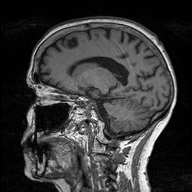
[im 111/172]
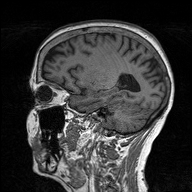
[im 121/172]
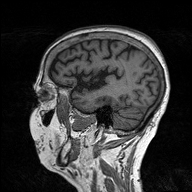
[im 131/172]
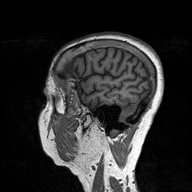
[im 141/172]
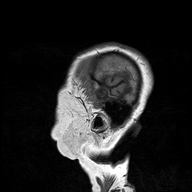
[im 151/172]
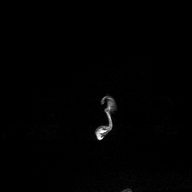
[im 161/172]
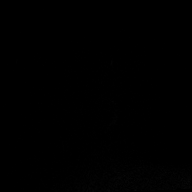
[im 172/172]
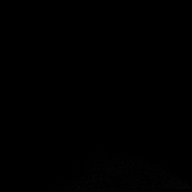

[Series 3: FLAIR · axial · 5.0mm · 0.94mm/px · z∈[-27,+115]mm · 3 of 27 slices shown]
[im 1/27]
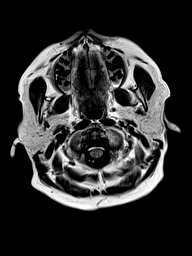
[im 14/27]
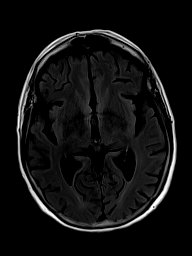
[im 27/27]
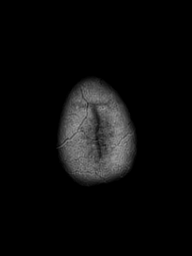

[Series 4: T2-star · axial · 5.0mm · 0.94mm/px · z∈[-27,+115]mm · 3 of 27 slices shown]
[im 1/27]
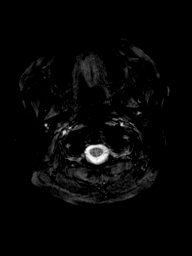
[im 14/27]
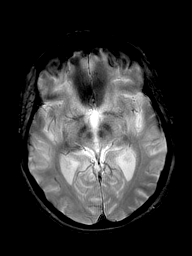
[im 27/27]
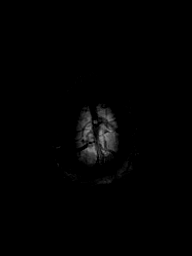

[Series 5: T2 · axial · 5.0mm · 0.94mm/px · z∈[-27,+115]mm · 3 of 27 slices shown]
[im 1/27]
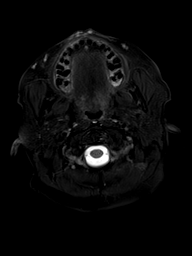
[im 14/27]
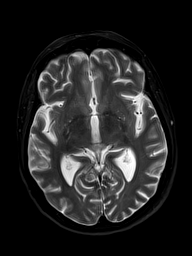
[im 27/27]
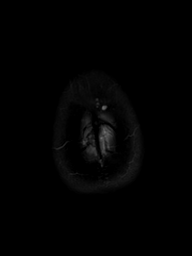

[Series 6: DWI · axial · 5.0mm · 0.94mm/px · z∈[-27,+115]mm · 12 of 108 slices shown]
[im 1/108]
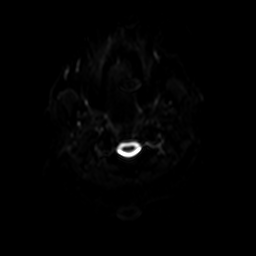
[im 10/108]
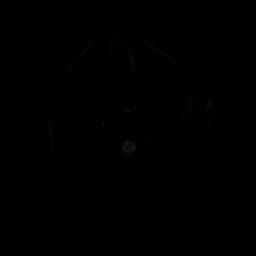
[im 20/108]
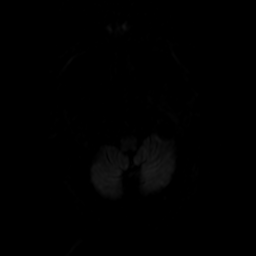
[im 30/108]
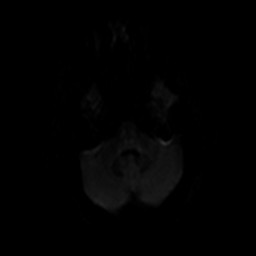
[im 39/108]
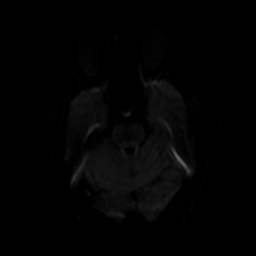
[im 49/108]
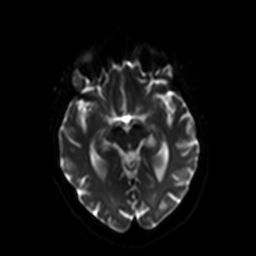
[im 59/108]
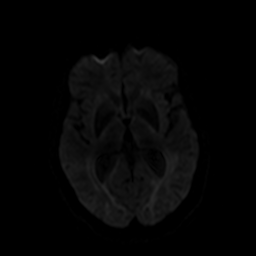
[im 69/108]
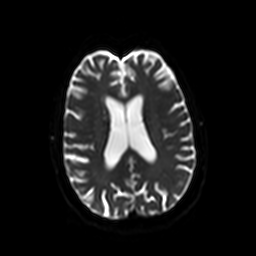
[im 78/108]
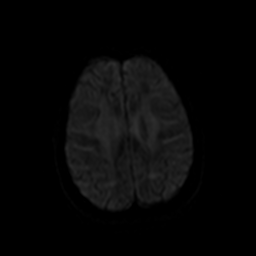
[im 88/108]
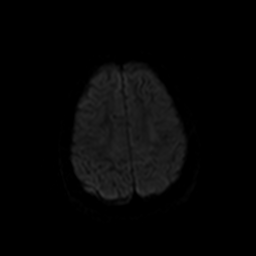
[im 98/108]
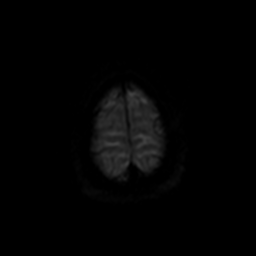
[im 108/108]
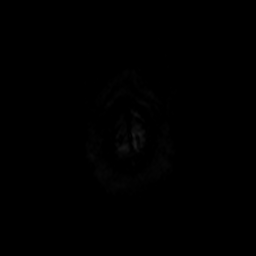

[Series 7: ax dwi_tracew · axial · 5.0mm · 0.94mm/px · z∈[-27,+115]mm · 6 of 54 slices shown]
[im 1/54]
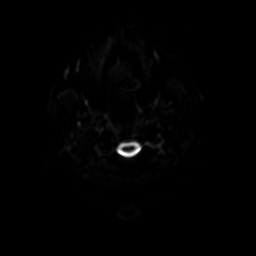
[im 11/54]
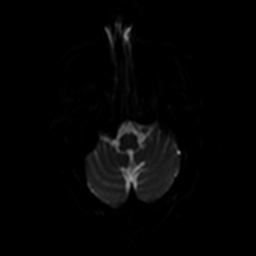
[im 22/54]
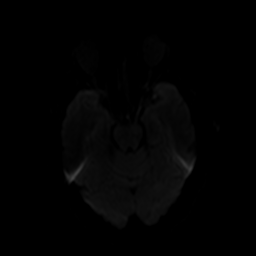
[im 32/54]
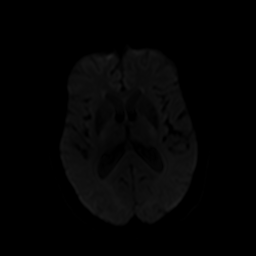
[im 43/54]
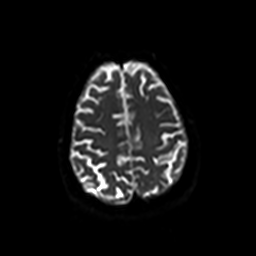
[im 54/54]
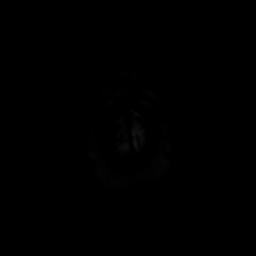

[Series 8: ax dwi_adc · axial · 5.0mm · 0.94mm/px · z∈[-27,+115]mm · 3 of 27 slices shown]
[im 1/27]
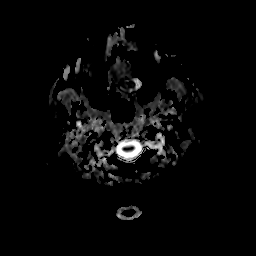
[im 14/27]
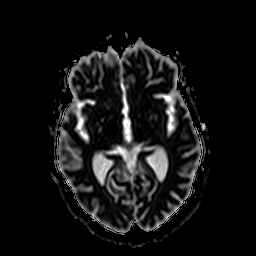
[im 27/27]
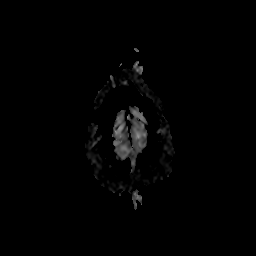

[48 of 48 positions shown; findings below may reference images not displayed]

FINDINGS: Brain: There is no acute infarction or intracranial hemorrhage.
There is no intracranial mass, mass effect, or edema. There is no
hydrocephalus or extra-axial fluid collection. Prominence of the
ventricles and sulci reflects stable mild parenchymal volume loss.

Vascular: Major vessel flow voids at the skull base are preserved.

Skull and upper cervical spine: Normal marrow signal is preserved.

Sinuses/Orbits: Paranasal sinuses are aerated. Orbits are
unremarkable.

Other: Sella is unremarkable.  Mastoid air cells are clear.
IMPRESSION: No evidence of recent infarction, hemorrhage, or mass. Stable mild
parenchymal volume loss.

## 2021-07-11 DIAGNOSIS — I10 Essential (primary) hypertension: Secondary | ICD-10-CM | POA: Diagnosis not present

## 2021-07-11 DIAGNOSIS — E039 Hypothyroidism, unspecified: Secondary | ICD-10-CM | POA: Diagnosis not present

## 2021-07-11 DIAGNOSIS — M8580 Other specified disorders of bone density and structure, unspecified site: Secondary | ICD-10-CM | POA: Diagnosis not present

## 2021-07-11 DIAGNOSIS — E785 Hyperlipidemia, unspecified: Secondary | ICD-10-CM | POA: Diagnosis not present

## 2021-07-26 DIAGNOSIS — Z23 Encounter for immunization: Secondary | ICD-10-CM | POA: Diagnosis not present

## 2021-08-09 DIAGNOSIS — I10 Essential (primary) hypertension: Secondary | ICD-10-CM | POA: Diagnosis not present

## 2021-08-09 DIAGNOSIS — E039 Hypothyroidism, unspecified: Secondary | ICD-10-CM | POA: Diagnosis not present

## 2021-08-09 DIAGNOSIS — M8580 Other specified disorders of bone density and structure, unspecified site: Secondary | ICD-10-CM | POA: Diagnosis not present

## 2021-08-09 DIAGNOSIS — E785 Hyperlipidemia, unspecified: Secondary | ICD-10-CM | POA: Diagnosis not present

## 2021-08-30 DIAGNOSIS — R748 Abnormal levels of other serum enzymes: Secondary | ICD-10-CM | POA: Diagnosis not present

## 2021-08-30 DIAGNOSIS — Z6825 Body mass index (BMI) 25.0-25.9, adult: Secondary | ICD-10-CM | POA: Diagnosis not present

## 2021-09-08 DIAGNOSIS — E039 Hypothyroidism, unspecified: Secondary | ICD-10-CM | POA: Diagnosis not present

## 2021-09-13 DIAGNOSIS — R748 Abnormal levels of other serum enzymes: Secondary | ICD-10-CM | POA: Diagnosis not present

## 2021-09-14 DIAGNOSIS — I10 Essential (primary) hypertension: Secondary | ICD-10-CM | POA: Diagnosis not present

## 2021-09-14 DIAGNOSIS — E785 Hyperlipidemia, unspecified: Secondary | ICD-10-CM | POA: Diagnosis not present

## 2021-09-14 DIAGNOSIS — L659 Nonscarring hair loss, unspecified: Secondary | ICD-10-CM | POA: Diagnosis not present

## 2021-09-14 DIAGNOSIS — E039 Hypothyroidism, unspecified: Secondary | ICD-10-CM | POA: Diagnosis not present

## 2021-09-14 DIAGNOSIS — Z6824 Body mass index (BMI) 24.0-24.9, adult: Secondary | ICD-10-CM | POA: Diagnosis not present

## 2021-10-09 DIAGNOSIS — E785 Hyperlipidemia, unspecified: Secondary | ICD-10-CM | POA: Diagnosis not present

## 2021-10-09 DIAGNOSIS — I10 Essential (primary) hypertension: Secondary | ICD-10-CM | POA: Diagnosis not present

## 2021-10-09 DIAGNOSIS — E039 Hypothyroidism, unspecified: Secondary | ICD-10-CM | POA: Diagnosis not present

## 2021-10-09 DIAGNOSIS — M8580 Other specified disorders of bone density and structure, unspecified site: Secondary | ICD-10-CM | POA: Diagnosis not present

## 2021-10-16 DIAGNOSIS — M79605 Pain in left leg: Secondary | ICD-10-CM | POA: Diagnosis not present

## 2021-10-16 DIAGNOSIS — M79604 Pain in right leg: Secondary | ICD-10-CM | POA: Diagnosis not present

## 2021-10-16 DIAGNOSIS — G629 Polyneuropathy, unspecified: Secondary | ICD-10-CM | POA: Diagnosis not present

## 2021-10-18 ENCOUNTER — Other Ambulatory Visit: Payer: Self-pay

## 2021-10-18 ENCOUNTER — Ambulatory Visit
Admission: RE | Admit: 2021-10-18 | Discharge: 2021-10-18 | Disposition: A | Discharge status: Home / Self Care | Payer: No Typology Code available for payment source | Source: Ambulatory Visit | Attending: Internal Medicine | Admitting: Internal Medicine

## 2021-10-18 DIAGNOSIS — Z006 Encounter for examination for normal comparison and control in clinical research program: Secondary | ICD-10-CM

## 2021-11-09 DIAGNOSIS — M8580 Other specified disorders of bone density and structure, unspecified site: Secondary | ICD-10-CM | POA: Diagnosis not present

## 2021-11-09 DIAGNOSIS — E039 Hypothyroidism, unspecified: Secondary | ICD-10-CM | POA: Diagnosis not present

## 2021-11-09 DIAGNOSIS — E785 Hyperlipidemia, unspecified: Secondary | ICD-10-CM | POA: Diagnosis not present

## 2021-11-09 DIAGNOSIS — I10 Essential (primary) hypertension: Secondary | ICD-10-CM | POA: Diagnosis not present

## 2021-11-13 DIAGNOSIS — Z6823 Body mass index (BMI) 23.0-23.9, adult: Secondary | ICD-10-CM | POA: Diagnosis not present

## 2021-11-13 DIAGNOSIS — N760 Acute vaginitis: Secondary | ICD-10-CM | POA: Diagnosis not present

## 2021-11-13 DIAGNOSIS — Z124 Encounter for screening for malignant neoplasm of cervix: Secondary | ICD-10-CM | POA: Diagnosis not present

## 2021-11-15 DIAGNOSIS — E039 Hypothyroidism, unspecified: Secondary | ICD-10-CM | POA: Diagnosis not present

## 2021-11-16 DIAGNOSIS — G629 Polyneuropathy, unspecified: Secondary | ICD-10-CM | POA: Diagnosis not present

## 2021-11-16 DIAGNOSIS — Z23 Encounter for immunization: Secondary | ICD-10-CM | POA: Diagnosis not present

## 2021-11-16 DIAGNOSIS — M79604 Pain in right leg: Secondary | ICD-10-CM | POA: Diagnosis not present

## 2021-11-16 DIAGNOSIS — M8580 Other specified disorders of bone density and structure, unspecified site: Secondary | ICD-10-CM | POA: Diagnosis not present

## 2021-11-16 DIAGNOSIS — E039 Hypothyroidism, unspecified: Secondary | ICD-10-CM | POA: Diagnosis not present

## 2021-11-16 DIAGNOSIS — R195 Other fecal abnormalities: Secondary | ICD-10-CM | POA: Diagnosis not present

## 2021-11-16 DIAGNOSIS — Z1389 Encounter for screening for other disorder: Secondary | ICD-10-CM | POA: Diagnosis not present

## 2021-11-16 DIAGNOSIS — I10 Essential (primary) hypertension: Secondary | ICD-10-CM | POA: Diagnosis not present

## 2021-11-16 DIAGNOSIS — M79605 Pain in left leg: Secondary | ICD-10-CM | POA: Diagnosis not present

## 2021-11-16 DIAGNOSIS — E785 Hyperlipidemia, unspecified: Secondary | ICD-10-CM | POA: Diagnosis not present

## 2021-11-16 DIAGNOSIS — Z0000 Encounter for general adult medical examination without abnormal findings: Secondary | ICD-10-CM | POA: Diagnosis not present

## 2021-11-21 DIAGNOSIS — E039 Hypothyroidism, unspecified: Secondary | ICD-10-CM | POA: Diagnosis not present

## 2021-11-21 DIAGNOSIS — L659 Nonscarring hair loss, unspecified: Secondary | ICD-10-CM | POA: Diagnosis not present

## 2021-11-21 DIAGNOSIS — E785 Hyperlipidemia, unspecified: Secondary | ICD-10-CM | POA: Diagnosis not present

## 2021-11-21 DIAGNOSIS — Z6824 Body mass index (BMI) 24.0-24.9, adult: Secondary | ICD-10-CM | POA: Diagnosis not present

## 2021-11-21 DIAGNOSIS — I10 Essential (primary) hypertension: Secondary | ICD-10-CM | POA: Diagnosis not present

## 2021-11-24 DIAGNOSIS — M79604 Pain in right leg: Secondary | ICD-10-CM | POA: Diagnosis not present

## 2021-11-24 DIAGNOSIS — M79605 Pain in left leg: Secondary | ICD-10-CM | POA: Diagnosis not present

## 2021-11-24 DIAGNOSIS — M25562 Pain in left knee: Secondary | ICD-10-CM | POA: Diagnosis not present

## 2021-11-28 ENCOUNTER — Other Ambulatory Visit: Payer: Self-pay | Admitting: Family Medicine

## 2021-11-28 ENCOUNTER — Other Ambulatory Visit: Payer: Self-pay | Admitting: Gastroenterology

## 2021-11-28 DIAGNOSIS — Z006 Encounter for examination for normal comparison and control in clinical research program: Secondary | ICD-10-CM

## 2021-11-28 DIAGNOSIS — R7401 Elevation of levels of liver transaminase levels: Secondary | ICD-10-CM | POA: Diagnosis not present

## 2021-11-28 DIAGNOSIS — R197 Diarrhea, unspecified: Secondary | ICD-10-CM | POA: Diagnosis not present

## 2021-11-28 DIAGNOSIS — R7989 Other specified abnormal findings of blood chemistry: Secondary | ICD-10-CM

## 2021-11-29 ENCOUNTER — Ambulatory Visit
Admission: RE | Admit: 2021-11-29 | Discharge: 2021-11-29 | Disposition: A | Discharge status: Home / Self Care | Payer: Self-pay | Source: Ambulatory Visit | Attending: Gastroenterology | Admitting: Gastroenterology

## 2021-11-29 DIAGNOSIS — R7989 Other specified abnormal findings of blood chemistry: Secondary | ICD-10-CM

## 2021-11-29 DIAGNOSIS — K760 Fatty (change of) liver, not elsewhere classified: Secondary | ICD-10-CM | POA: Diagnosis not present

## 2021-11-29 IMAGING — US US ABDOMEN LIMITED
1 series · 14 of 25 positions shown · non-contrast
Comparison: None.

CLINICAL DATA: History of prior cholecystectomy with elevated liver
function tests.

EXAM:
ULTRASOUND ABDOMEN LIMITED RIGHT UPPER QUADRANT

[Series 1: us abdomen limited · 0.21mm/px · 14 of 34 slices shown]
[im 1/34]
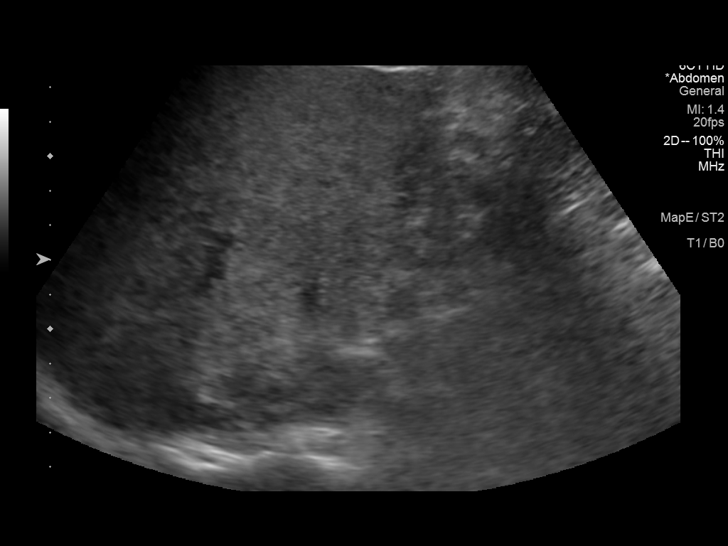
[im 3/34]
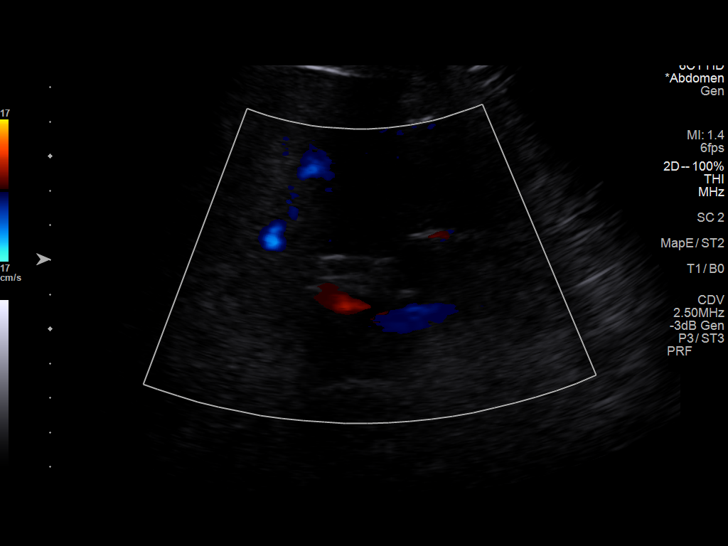
[im 6/34]
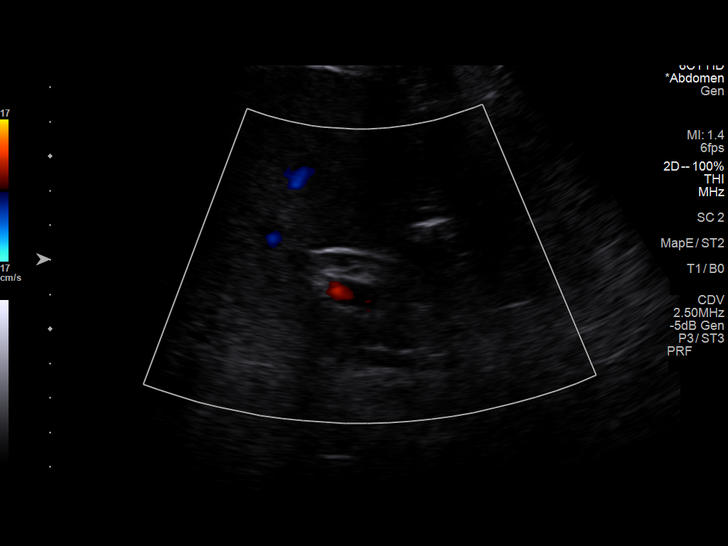
[im 9/34]
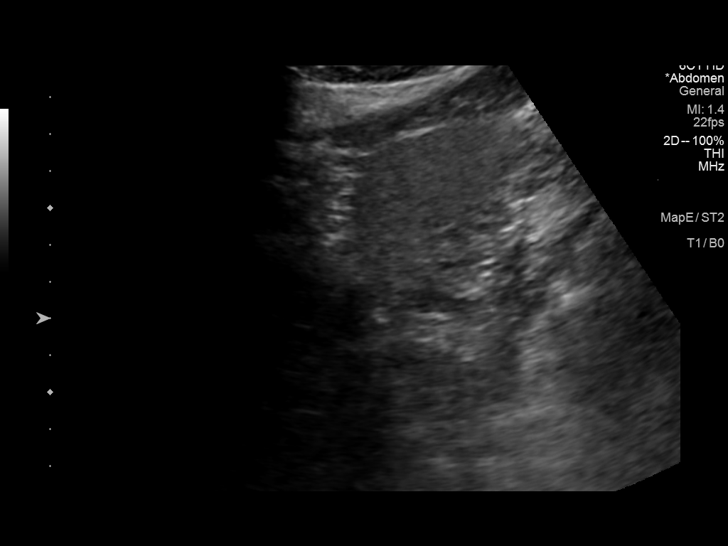
[im 12/34]
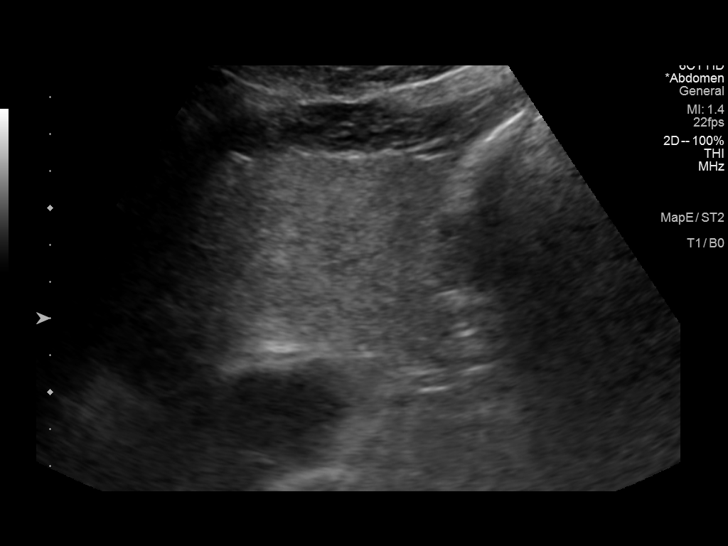
[im 13/34]
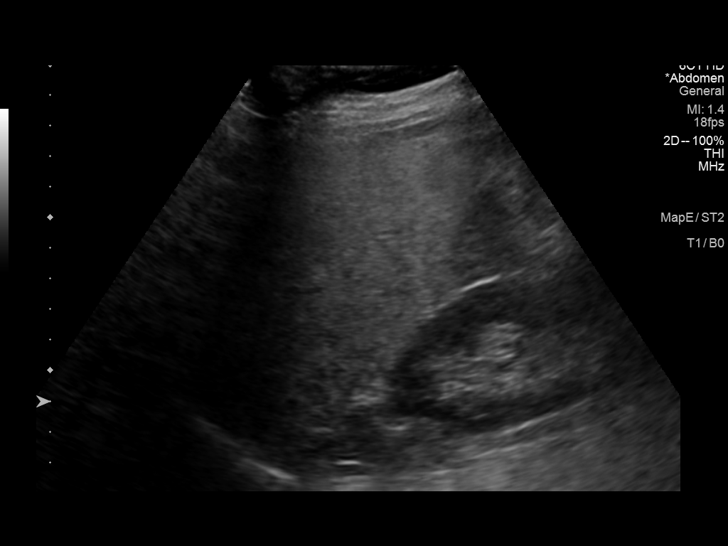
[im 16/34]
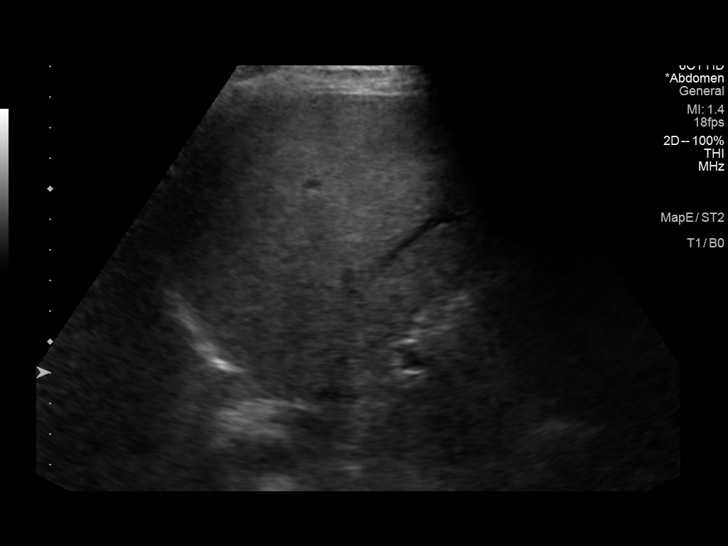
[im 18/34]
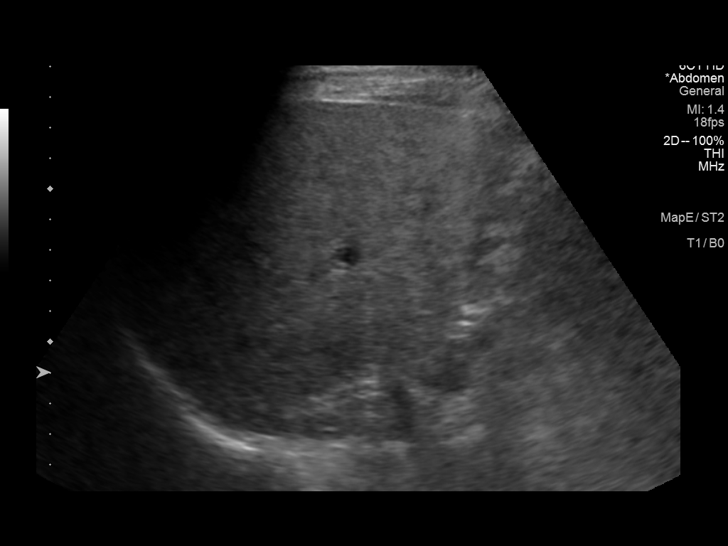
[im 21/34]
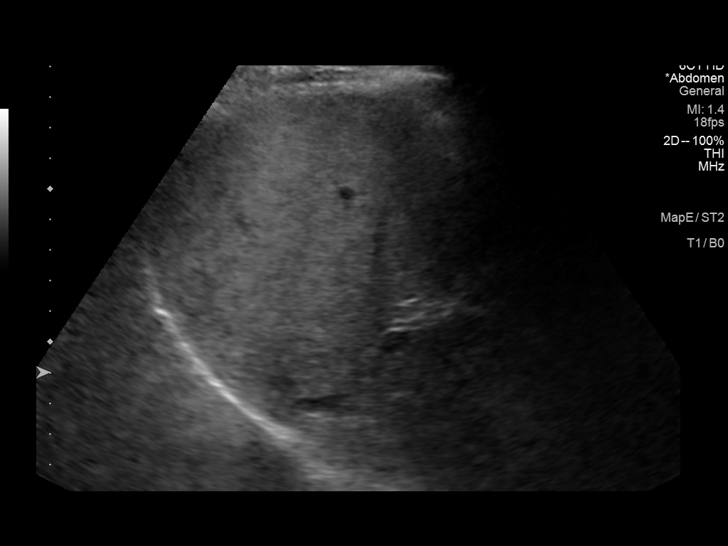
[im 23/34]
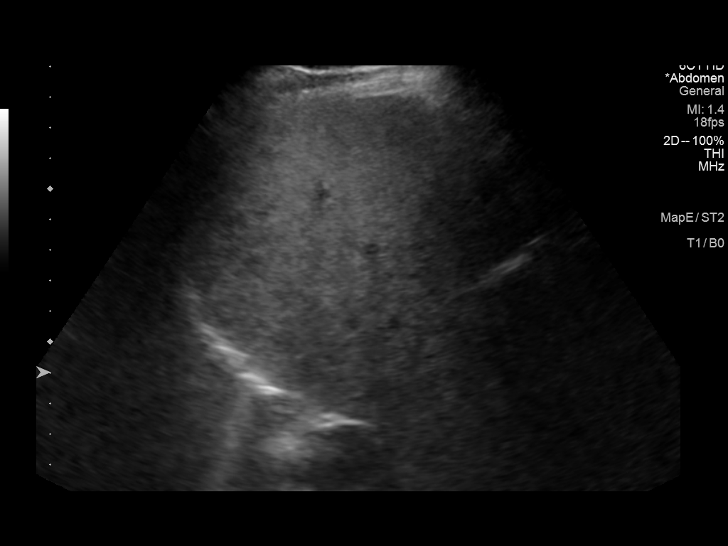
[im 25/34]
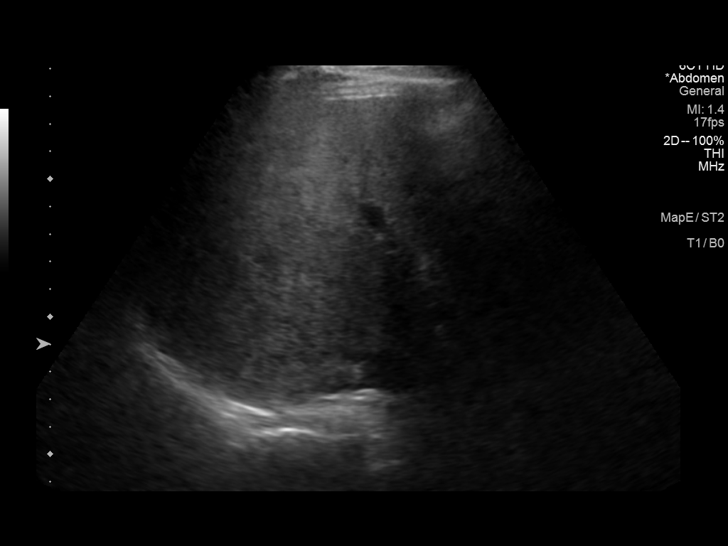
[im 28/34]
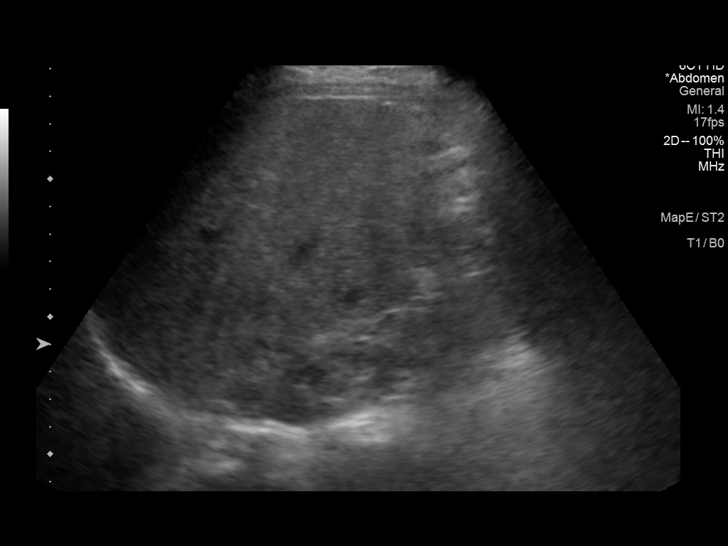
[im 31/34]
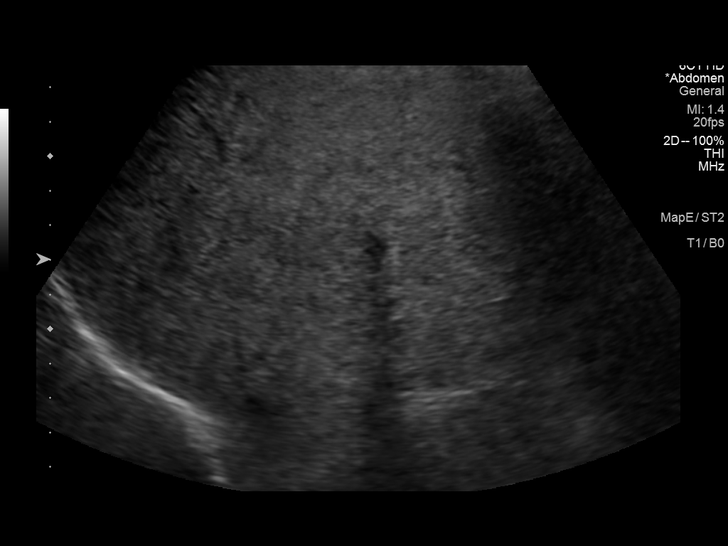
[im 34/34]
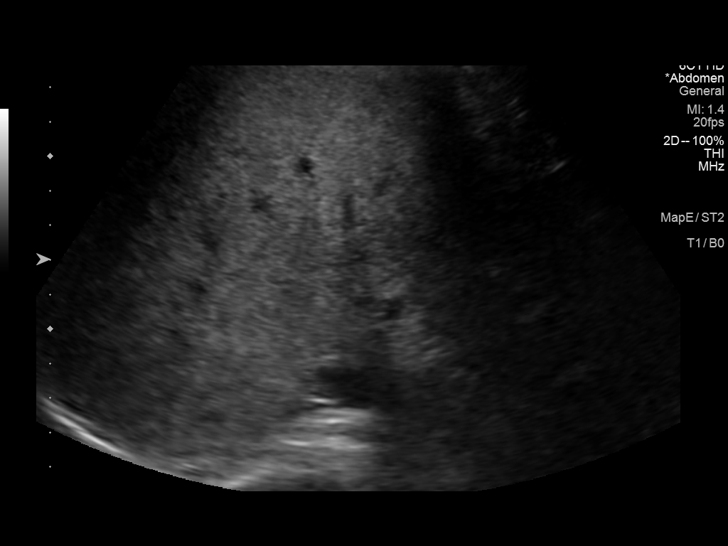

[14 of 25 positions shown; findings below may reference images not displayed]

FINDINGS: Gallbladder:

The gallbladder is surgically absent.

Common bile duct:

Diameter: 4.7 mm

Liver:

No focal lesion identified. Diffusely increased echogenicity of the
liver parenchyma is noted. Portal vein is patent on color Doppler
imaging with normal direction of blood flow towards the liver.

Other: None.
IMPRESSION: 1. Findings consistent with history of prior cholecystectomy.
2. Hepatic steatosis without focal liver lesions.

## 2021-12-01 ENCOUNTER — Other Ambulatory Visit: Payer: Self-pay

## 2021-12-12 ENCOUNTER — Ambulatory Visit
Admission: RE | Admit: 2021-12-12 | Discharge: 2021-12-12 | Disposition: A | Discharge status: Home / Self Care | Payer: No Typology Code available for payment source | Source: Ambulatory Visit | Attending: Family Medicine | Admitting: Family Medicine

## 2021-12-12 DIAGNOSIS — G309 Alzheimer's disease, unspecified: Secondary | ICD-10-CM | POA: Diagnosis not present

## 2021-12-12 DIAGNOSIS — Z006 Encounter for examination for normal comparison and control in clinical research program: Secondary | ICD-10-CM

## 2021-12-12 DIAGNOSIS — G319 Degenerative disease of nervous system, unspecified: Secondary | ICD-10-CM | POA: Diagnosis not present

## 2021-12-12 IMAGING — MR MR HEAD W/O CM
7 series · 48 of 48 positions shown · non-contrast
Comparison: MRI [DATE].

CLINICAL DATA: Alzheimer research study.

EXAM:
MRI HEAD WITHOUT CONTRAST
TECHNIQUE: Multiplanar, multiecho pulse sequences of the brain and surrounding
structures were obtained without intravenous contrast.

[Series 2: 3dt1 sag · sagittal · 1.2mm · 1.25mm/px · 18 of 174 slices shown]
[im 1/174]
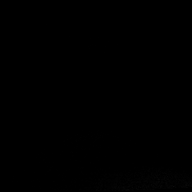
[im 11/174]
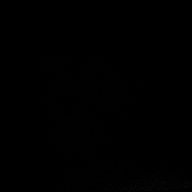
[im 21/174]
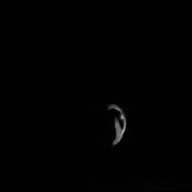
[im 31/174]
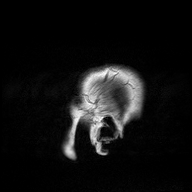
[im 41/174]
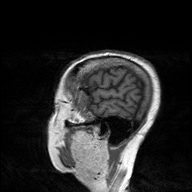
[im 51/174]
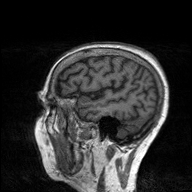
[im 62/174]
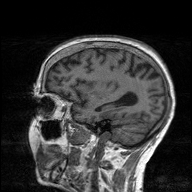
[im 72/174]
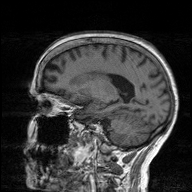
[im 82/174]
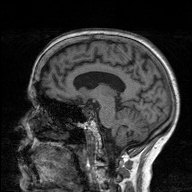
[im 92/174]
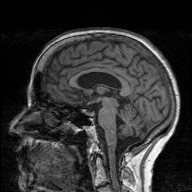
[im 102/174]
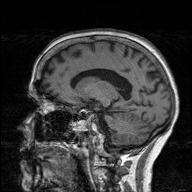
[im 112/174]
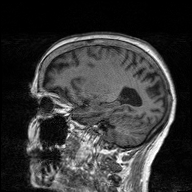
[im 123/174]
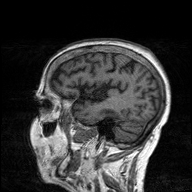
[im 133/174]
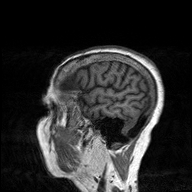
[im 143/174]
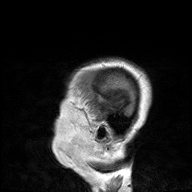
[im 153/174]
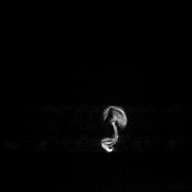
[im 163/174]
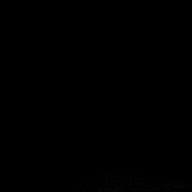
[im 174/174]
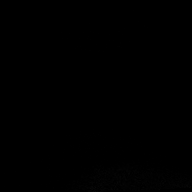

[Series 3: FLAIR · axial · 5.0mm · 0.94mm/px · z∈[-69,+95]mm · 3 of 31 slices shown]
[im 1/31]
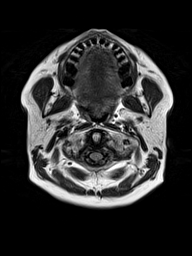
[im 16/31]
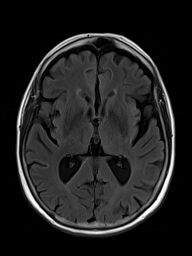
[im 31/31]
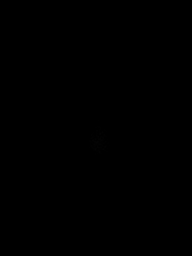

[Series 4: T2-star · axial · 5.0mm · 0.94mm/px · z∈[-69,+95]mm · 3 of 31 slices shown]
[im 1/31]
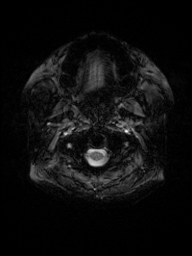
[im 16/31]
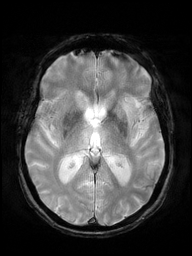
[im 31/31]
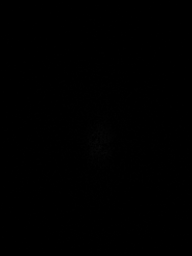

[Series 5: T2 · axial · 5.0mm · 0.94mm/px · z∈[-69,+95]mm · 3 of 31 slices shown]
[im 1/31]
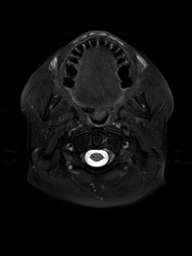
[im 16/31]
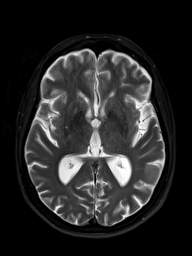
[im 31/31]
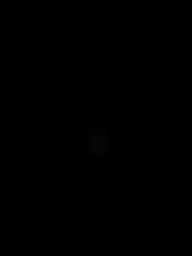

[Series 6: DWI · axial · 5.0mm · 0.94mm/px · z∈[-69,+95]mm · 12 of 124 slices shown]
[im 1/124]
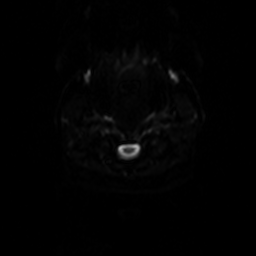
[im 12/124]
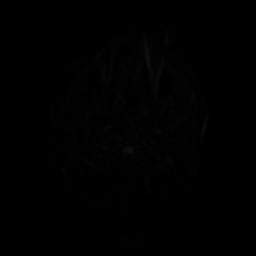
[im 23/124]
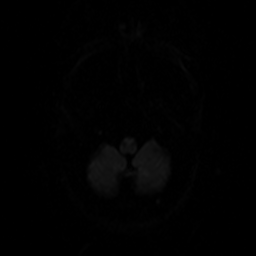
[im 34/124]
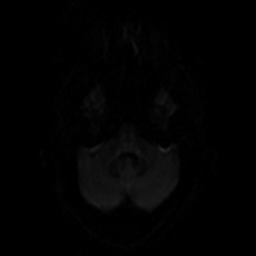
[im 45/124]
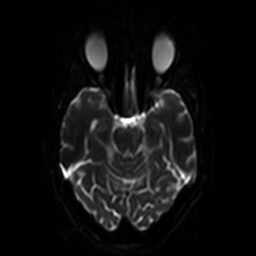
[im 56/124]
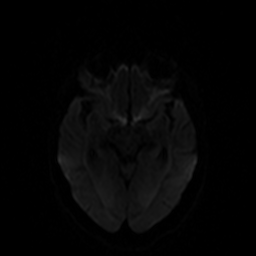
[im 68/124]
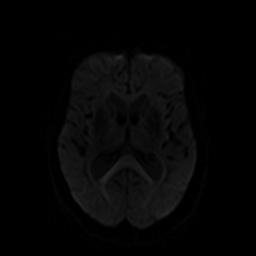
[im 79/124]
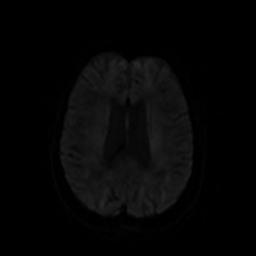
[im 90/124]
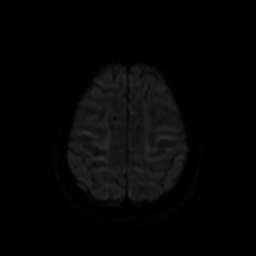
[im 101/124]
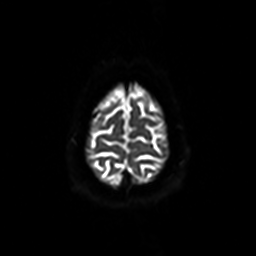
[im 112/124]
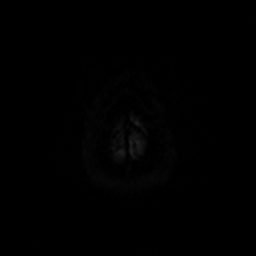
[im 124/124]
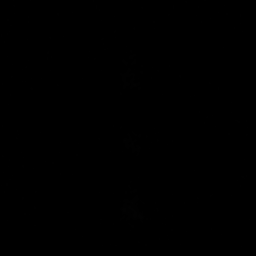

[Series 7: ax dwi_tracew · axial · 5.0mm · 0.94mm/px · z∈[-69,+95]mm · 6 of 61 slices shown]
[im 1/61]
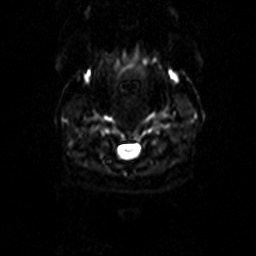
[im 13/61]
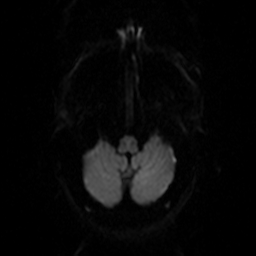
[im 25/61]
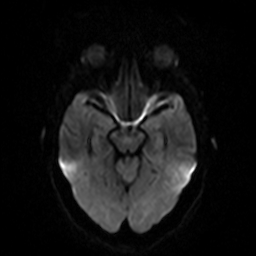
[im 37/61]
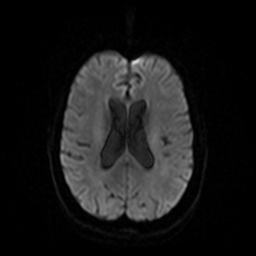
[im 49/61]
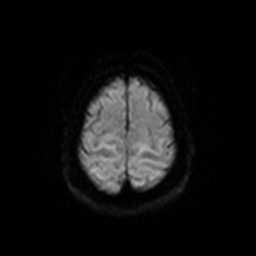
[im 61/61]
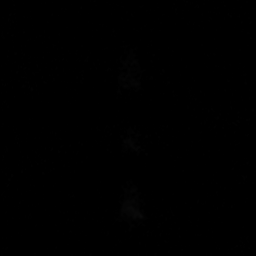

[Series 8: ax dwi_adc · axial · 5.0mm · 0.94mm/px · z∈[-69,+90]mm · 3 of 30 slices shown]
[im 1/30]
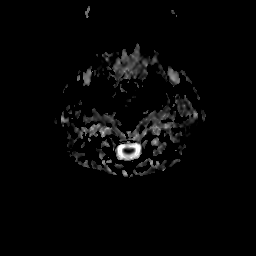
[im 15/30]
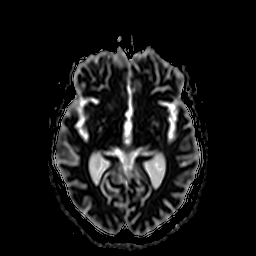
[im 30/30]
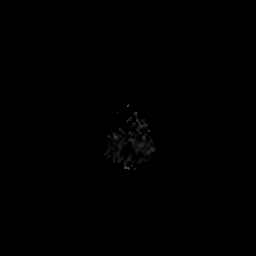

[48 of 48 positions shown; findings below may reference images not displayed]

FINDINGS: Brain: No acute infarction, hemorrhage, hydrocephalus, extra-axial
collection or mass lesion. Mild for age atrophy.

Vascular: Major arterial flow voids are maintained skull base.

Skull and upper cervical spine: Normal marrow signal.

Sinuses/Orbits: Clear sinuses.

Other: No mastoid effusions.
IMPRESSION: 1. No evidence of acute abnormality.
2. Similar mild for age cerebral atrophy.

## 2022-01-16 ENCOUNTER — Other Ambulatory Visit: Payer: Self-pay | Admitting: Family Medicine

## 2022-01-16 DIAGNOSIS — Z006 Encounter for examination for normal comparison and control in clinical research program: Secondary | ICD-10-CM

## 2022-01-19 DIAGNOSIS — E039 Hypothyroidism, unspecified: Secondary | ICD-10-CM | POA: Diagnosis not present

## 2022-01-31 ENCOUNTER — Other Ambulatory Visit: Payer: Self-pay

## 2022-01-31 ENCOUNTER — Ambulatory Visit
Admission: RE | Admit: 2022-01-31 | Discharge: 2022-01-31 | Disposition: A | Discharge status: Home / Self Care | Payer: Medicare Other | Source: Ambulatory Visit | Attending: Family Medicine | Admitting: Family Medicine

## 2022-01-31 DIAGNOSIS — G319 Degenerative disease of nervous system, unspecified: Secondary | ICD-10-CM | POA: Diagnosis not present

## 2022-01-31 DIAGNOSIS — Z006 Encounter for examination for normal comparison and control in clinical research program: Secondary | ICD-10-CM

## 2022-01-31 IMAGING — MR MR HEAD W/O CM
7 series · 48 of 48 positions shown · non-contrast
Comparison: Prior brain MRI examinations [DATE] and earlier.

CLINICAL DATA: Provided history: Exam for clinical research.

EXAM:
MRI HEAD WITHOUT CONTRAST
TECHNIQUE: Multiplanar, multiecho pulse sequences of the brain and surrounding
structures were obtained without intravenous contrast.

[Series 2: 3dt1 sag · sagittal · 1.2mm · 1.25mm/px · 18 of 176 slices shown]
[im 1/176]
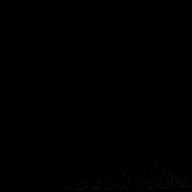
[im 11/176]
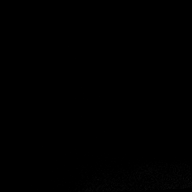
[im 21/176]
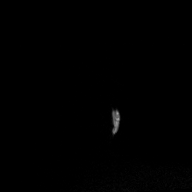
[im 31/176]
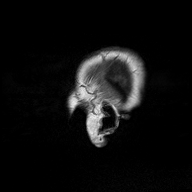
[im 42/176]
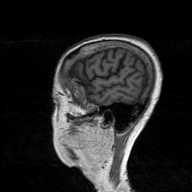
[im 52/176]
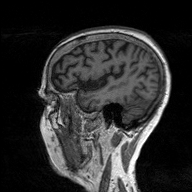
[im 62/176]
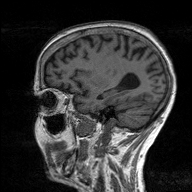
[im 73/176]
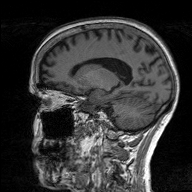
[im 83/176]
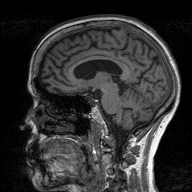
[im 93/176]
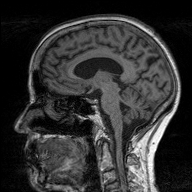
[im 103/176]
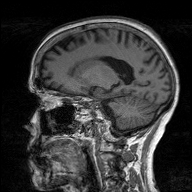
[im 114/176]
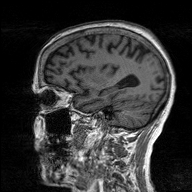
[im 124/176]
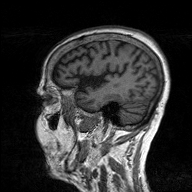
[im 134/176]
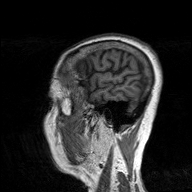
[im 145/176]
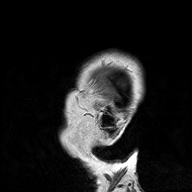
[im 155/176]
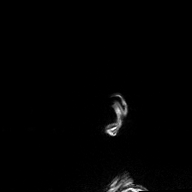
[im 165/176]
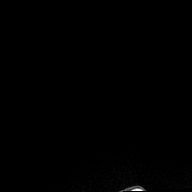
[im 176/176]
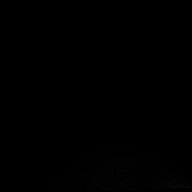

[Series 3: FLAIR · axial · 5.0mm · 0.94mm/px · z∈[-52,+107]mm · 3 of 30 slices shown]
[im 1/30]
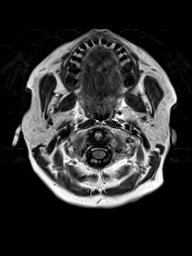
[im 15/30]
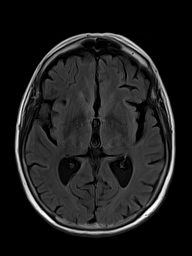
[im 30/30]
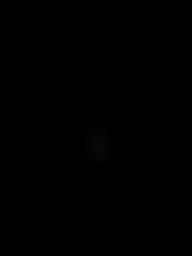

[Series 4: T2-star · axial · 5.0mm · 0.94mm/px · z∈[-52,+113]mm · 3 of 31 slices shown]
[im 1/31]
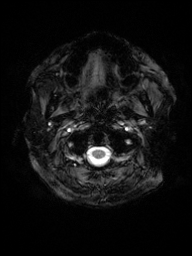
[im 16/31]
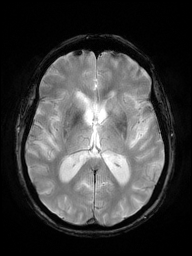
[im 31/31]
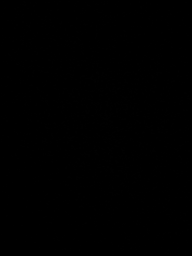

[Series 5: T2 · axial · 5.0mm · 0.94mm/px · z∈[-52,+107]mm · 3 of 30 slices shown]
[im 1/30]
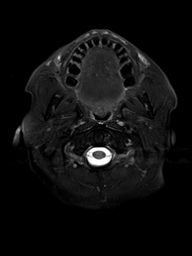
[im 15/30]
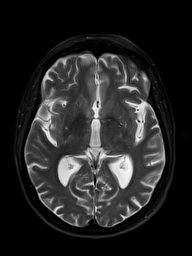
[im 30/30]
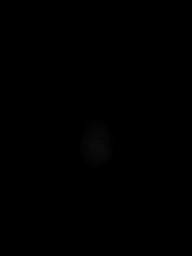

[Series 6: DWI · axial · 5.0mm · 0.94mm/px · z∈[-52,+113]mm · 12 of 122 slices shown]
[im 1/122]
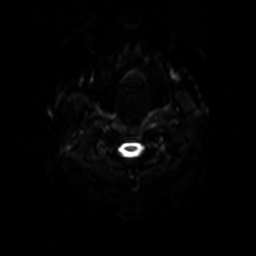
[im 12/122]
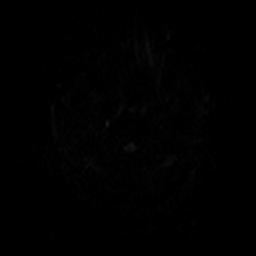
[im 23/122]
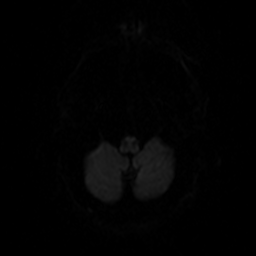
[im 34/122]
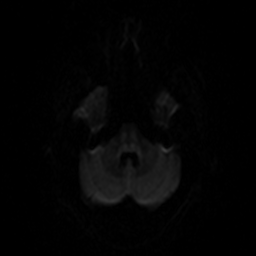
[im 45/122]
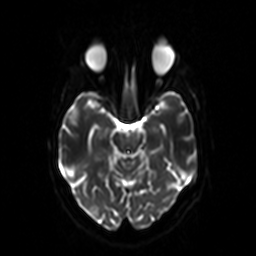
[im 56/122]
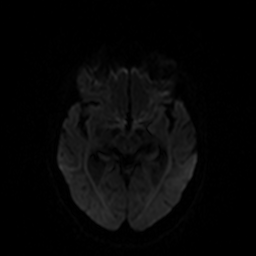
[im 67/122]
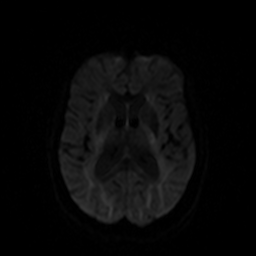
[im 78/122]
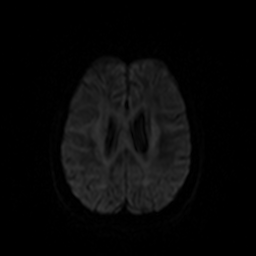
[im 89/122]
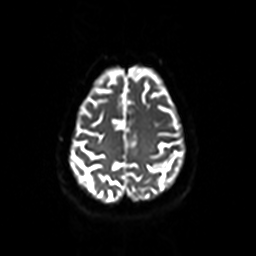
[im 100/122]
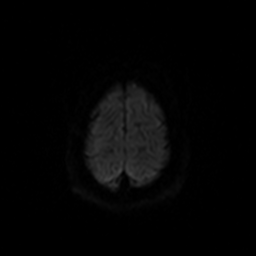
[im 111/122]
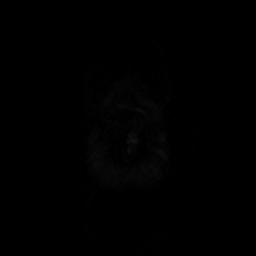
[im 122/122]
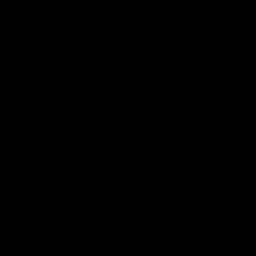

[Series 7: ax dwi_tracew · axial · 5.0mm · 0.94mm/px · z∈[-52,+113]mm · 6 of 62 slices shown]
[im 1/62]
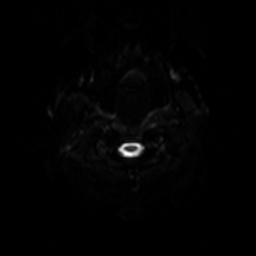
[im 13/62]
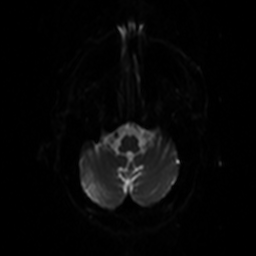
[im 25/62]
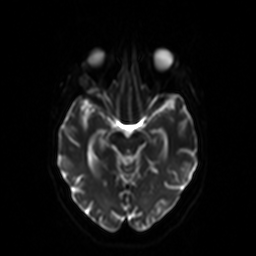
[im 37/62]
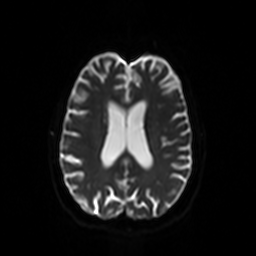
[im 49/62]
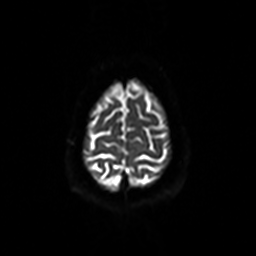
[im 62/62]
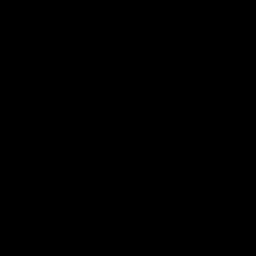

[Series 8: ax dwi_adc · axial · 5.0mm · 0.94mm/px · z∈[-52,+107]mm · 3 of 30 slices shown]
[im 1/30]
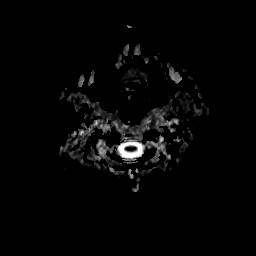
[im 15/30]
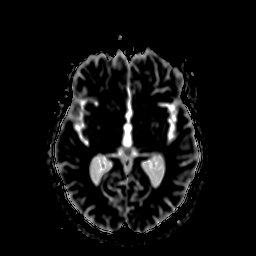
[im 30/30]
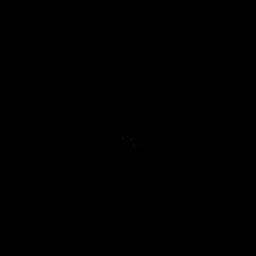

[48 of 48 positions shown; findings below may reference images not displayed]

FINDINGS: Brain:

A research protocol brain MRI was performed consisting of the
following sequences: Sagittal 3D T1-weighted sequence, axial T2 GRE
sequence, axial T2 TSE sequence, axial T2 FLAIR sequence, axial
diffusion-weighted imaging.

Mild generalized cerebral atrophy.

No cortical encephalomalacia is identified. No significant cerebral
white matter disease.

There is no acute infarct.

No evidence of an intracranial mass.

No chronic intracranial blood products.

No extra-axial fluid collection.

No midline shift.

Vascular: Maintained flow voids within the proximal large arterial
vessels.

Skull and upper cervical spine: No focal suspicious marrow lesion.

Sinuses/Orbits: Visualized orbits show no acute finding. Trace
mucosal thickening within the bilateral ethmoid and left maxillary
sinuses.
IMPRESSION: Research protocol brain MRI.

No evidence of acute intracranial abnormality.

Mild generalized cerebral atrophy, stable from the prior examination
of [DATE].

## 2022-02-02 ENCOUNTER — Other Ambulatory Visit (HOSPITAL_COMMUNITY): Payer: Self-pay | Admitting: Gastroenterology

## 2022-02-02 ENCOUNTER — Other Ambulatory Visit: Payer: Self-pay | Admitting: Gastroenterology

## 2022-02-02 DIAGNOSIS — K760 Fatty (change of) liver, not elsewhere classified: Secondary | ICD-10-CM

## 2022-02-02 DIAGNOSIS — R197 Diarrhea, unspecified: Secondary | ICD-10-CM | POA: Diagnosis not present

## 2022-02-19 ENCOUNTER — Ambulatory Visit (HOSPITAL_COMMUNITY)
Admission: RE | Admit: 2022-02-19 | Discharge: 2022-02-19 | Disposition: A | Discharge status: Home / Self Care | Payer: Medicare Other | Source: Ambulatory Visit | Attending: Gastroenterology | Admitting: Gastroenterology

## 2022-02-19 DIAGNOSIS — K760 Fatty (change of) liver, not elsewhere classified: Secondary | ICD-10-CM | POA: Insufficient documentation

## 2022-02-19 IMAGING — US US ABDOMEN COMPLETE W/ ELASTOGRAPHY
2 series · 12 of 25 positions shown · non-contrast
Comparison: [DATE]

CLINICAL DATA: Non alcoholic fatty liver disease.

EXAM:
ULTRASOUND ABDOMEN
ULTRASOUND HEPATIC ELASTOGRAPHY
TECHNIQUE: Sonography of the upper abdomen was performed. In addition,
ultrasound elastography evaluation of the liver was performed. A
region of interest was placed within the right lobe of the liver.
Following application of a compressive sonographic pulse, tissue
compressibility was assessed. Multiple assessments were performed at
the selected site. Median tissue compressibility was determined.
Previously, hepatic stiffness was assessed by shear wave velocity.
Based on recently published Society of Radiologists in Ultrasound
consensus article, reporting is now recommended to be performed in
the SI units of pressure (kiloPascals) representing hepatic
stiffness/elasticity. The obtained result is compared to the
published reference standards. (cACLD = compensated Advanced Chronic
Liver Disease)

[Series 1: us abdomen complete w/elastography · 11 of 83 slices shown]
[im 4/83]
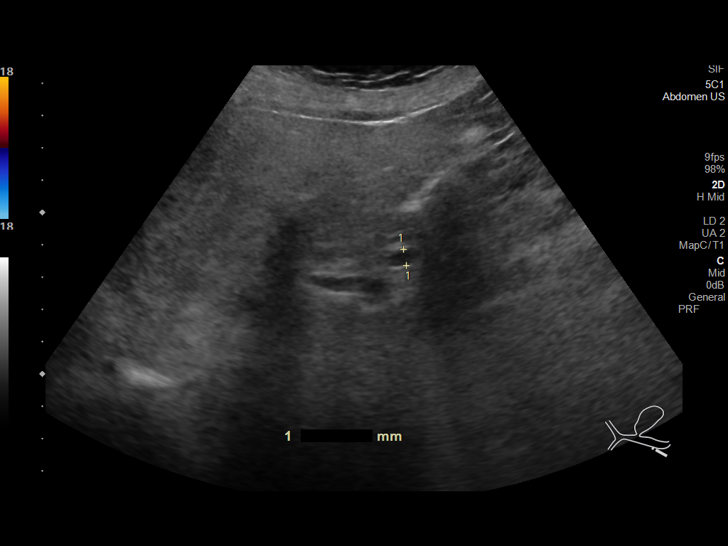
[im 12/83]
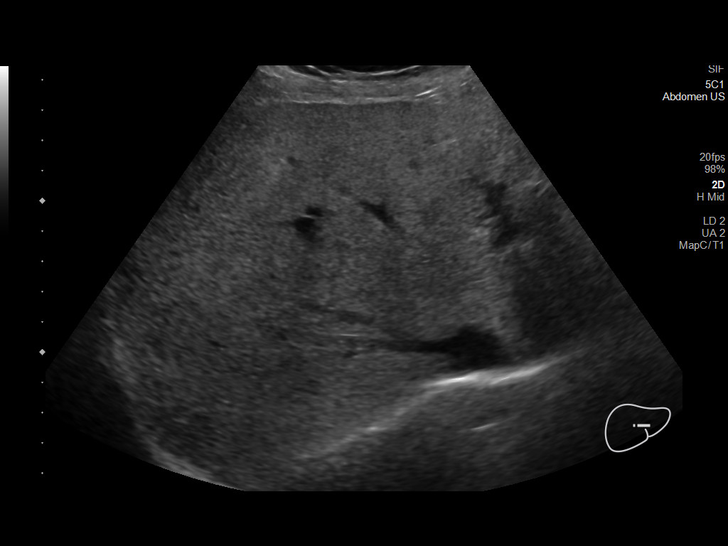
[im 20/83]
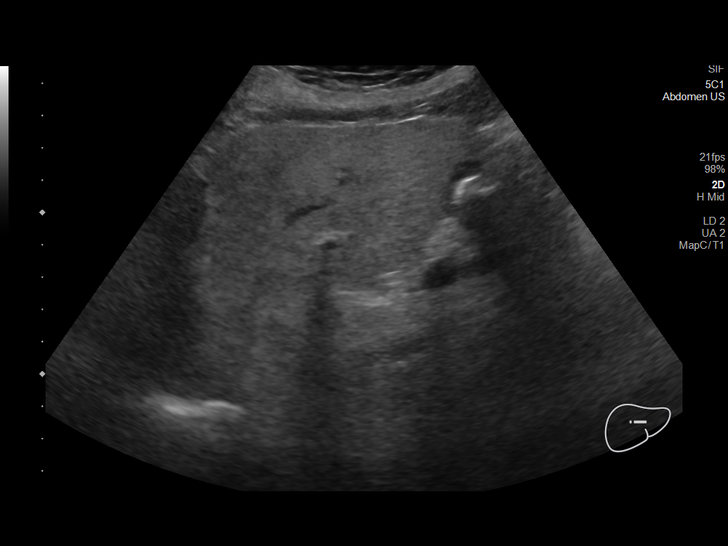
[im 28/83]
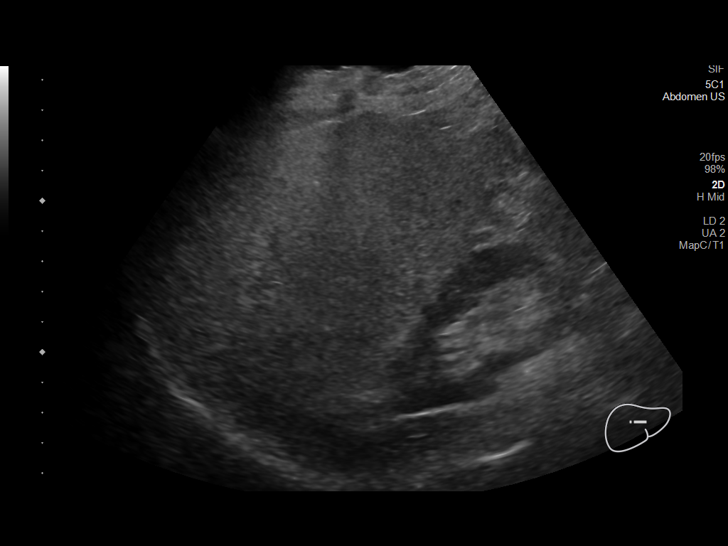
[im 36/83]
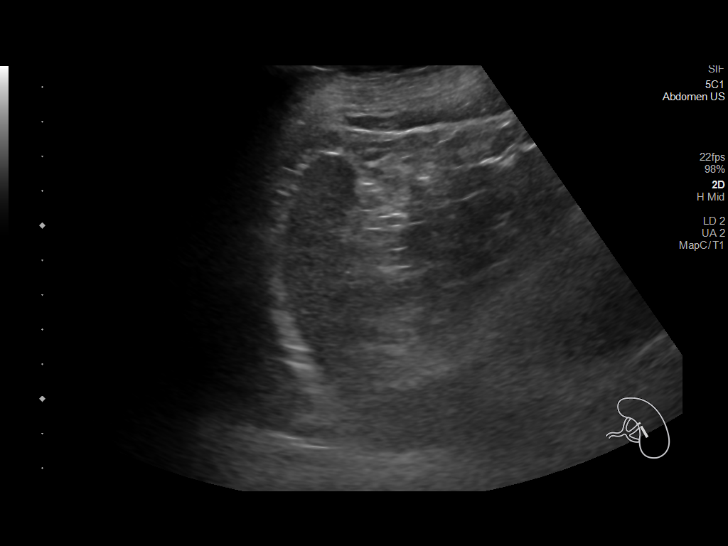
[im 43/83]
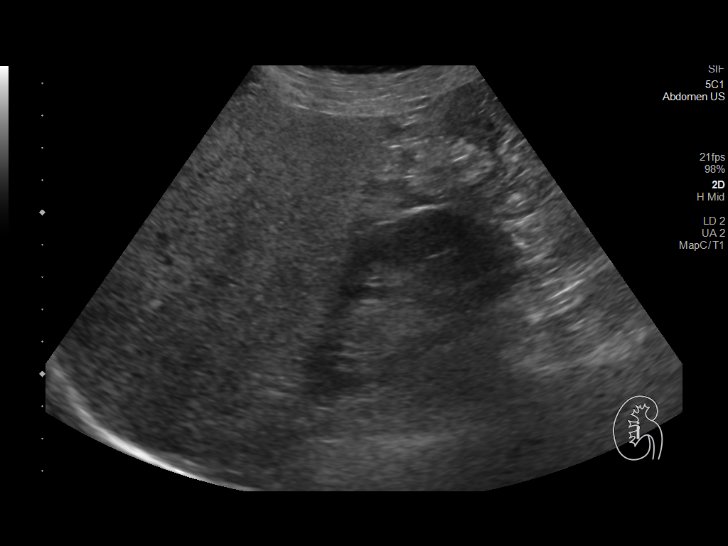
[im 51/83]
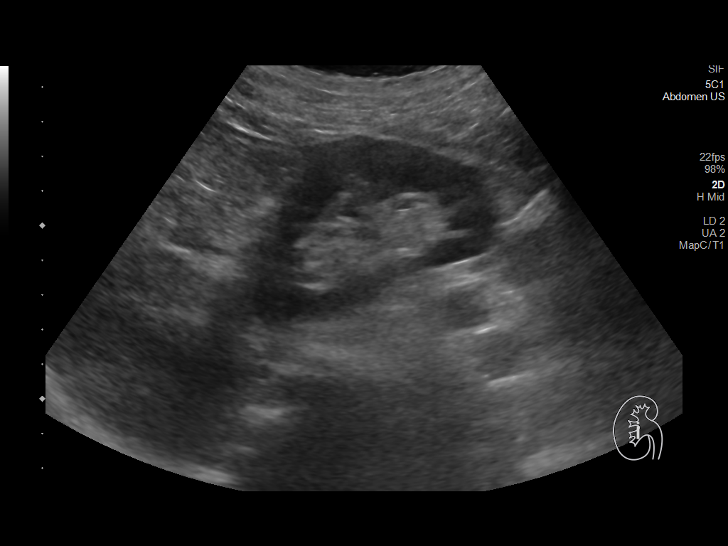
[im 59/83]
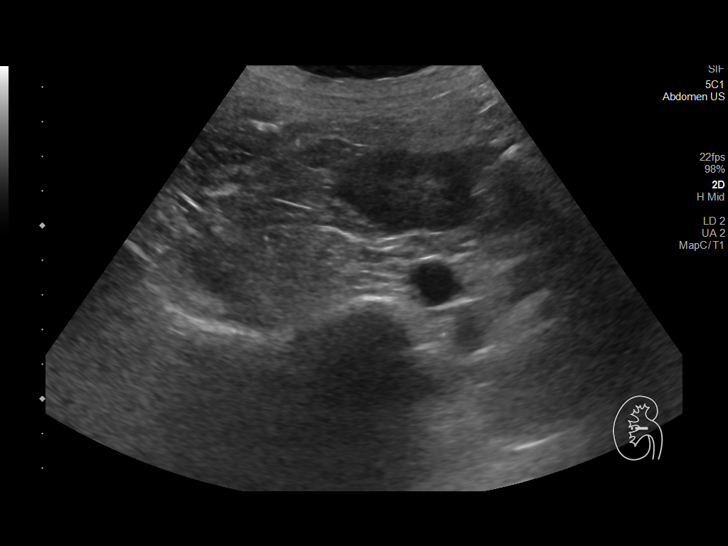
[im 67/83]
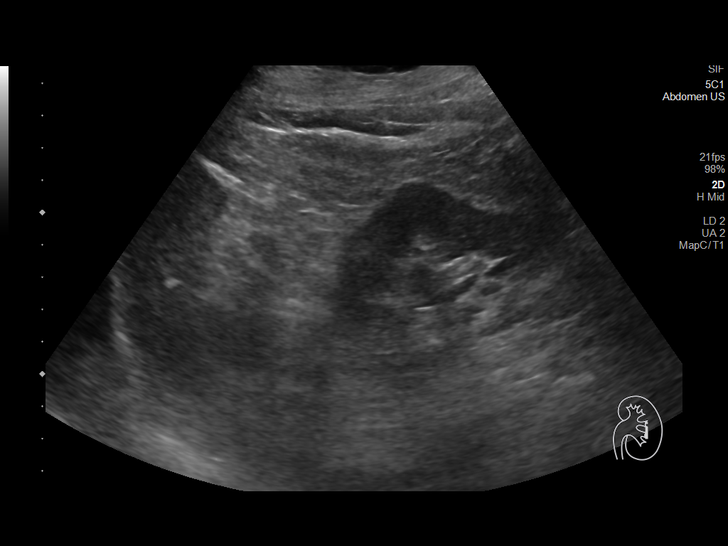
[im 75/83]
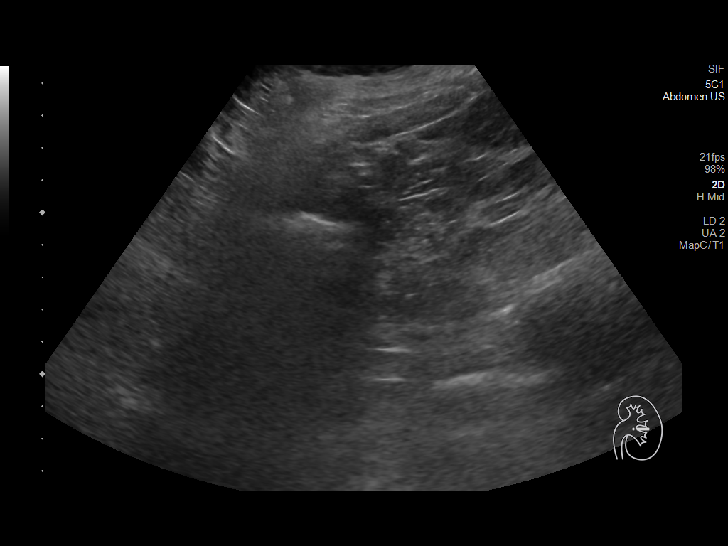
[im 83/83]
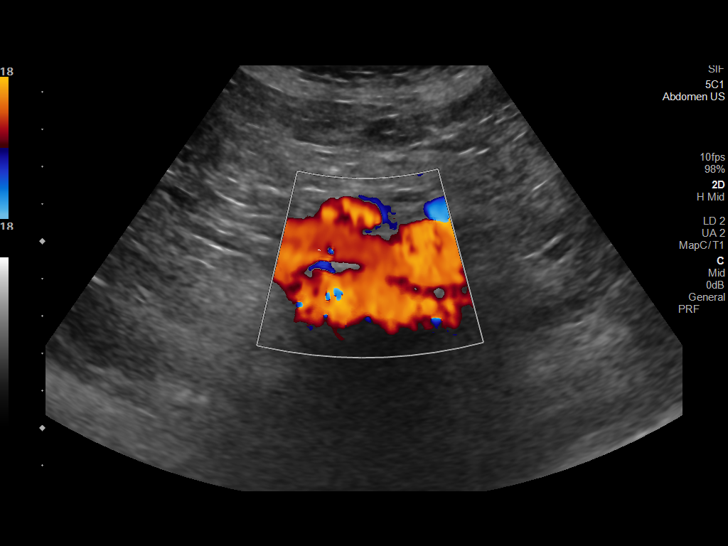

[Series 1001: abdomen us · 1 of 11 slices shown]
[im 6/11]
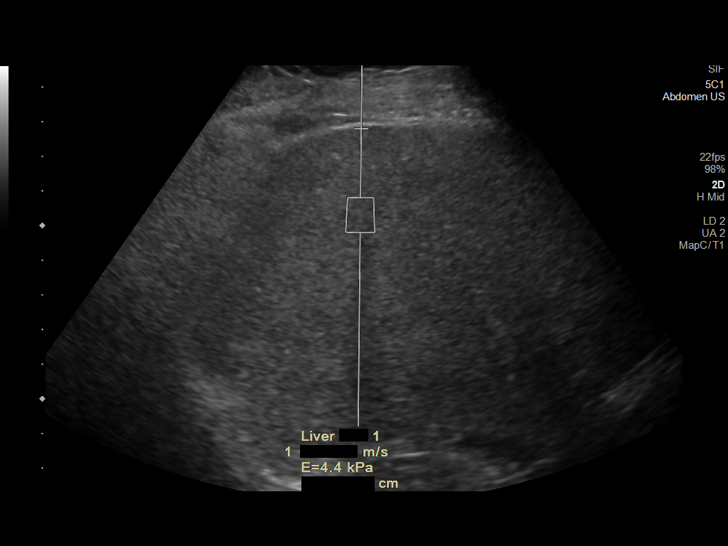

[12 of 25 positions shown; findings below may reference images not displayed]

FINDINGS: ULTRASOUND ABDOMEN

Gallbladder: Surgically absent.

Common bile duct: Diameter: 5 mm

Liver: No focal lesion identified. Diffusely increased parenchymal
echogenicity. Portal vein is patent on color Doppler imaging with
normal direction of blood flow towards the liver.

IVC: No abnormality visualized.

Pancreas: Visualized portion unremarkable.

Spleen: Size and appearance within normal limits.

Right Kidney: Length: 8.2 cm. Echogenicity within normal limits. No
mass or hydronephrosis visualized.

Left Kidney: Length: 9.1 cm. Echogenicity within normal limits. No
mass or hydronephrosis visualized.

Abdominal aorta: No aneurysm visualized.

Other findings: None.

ULTRASOUND HEPATIC ELASTOGRAPHY

Device: Siemens Helix VTQ

Patient position: Oblique

Transducer 5C1

Number of measurements: 10

Hepatic segment:  8

Median kPa:

IQR:

IQR/Median kPa ratio:

Data quality:  Good

Diagnostic category:  < or = 5 kPa: high probability of being normal

The use of hepatic elastography is applicable to patients with viral
hepatitis and non-alcoholic fatty liver disease. At this time, there
is insufficient data for the referenced cut-off values and use in
other causes of liver disease, including alcoholic liver disease.
Patients, however, may be assessed by elastography and serve as
their own reference standard/baseline.

In patients with non-alcoholic liver disease, the values suggesting
compensated advanced chronic liver disease (cACLD) may be lower, and
patients may need additional testing with elasticity results of [DATE]
kPa.

Please note that abnormal hepatic elasticity and shear wave
velocities may also be identified in clinical settings other than
with hepatic fibrosis, such as: acute hepatitis, elevated right
heart and central venous pressures including use of beta blockers,
FERDUSA disease (FERDUSA), infiltrative processes such as
mastocytosis/amyloidosis/infiltrative tumor/lymphoma, extrahepatic
cholestasis, with hyperemia in the post-prandial state, and with
liver transplantation. Correlation with patient history, laboratory
data, and clinical condition recommended.

Diagnostic Categories:

< or =5 kPa: high probability of being normal

< or =9 kPa: in the absence of other known clinical signs, rules [DATE] kPa and ?13 kPa: suggestive of cACLD, but needs further testing

>13 kPa: highly suggestive of cACLD

> or =17 kPa: highly suggestive of cACLD with an increased
probability of clinically significant portal hypertension
IMPRESSION: ULTRASOUND ABDOMEN:

Hepatic steatosis without overt hepatic lesion identified.

ULTRASOUND HEPATIC ELASTOGRAPHY:

Median kPa:

Diagnostic category:  < or = 5 kPa: high probability of being normal

## 2022-05-21 DIAGNOSIS — E039 Hypothyroidism, unspecified: Secondary | ICD-10-CM | POA: Diagnosis not present

## 2022-05-28 DIAGNOSIS — R5383 Other fatigue: Secondary | ICD-10-CM | POA: Diagnosis not present

## 2022-05-28 DIAGNOSIS — E039 Hypothyroidism, unspecified: Secondary | ICD-10-CM | POA: Diagnosis not present

## 2022-06-12 DIAGNOSIS — B351 Tinea unguium: Secondary | ICD-10-CM | POA: Diagnosis not present

## 2022-06-12 DIAGNOSIS — M79671 Pain in right foot: Secondary | ICD-10-CM | POA: Diagnosis not present

## 2022-06-12 DIAGNOSIS — L600 Ingrowing nail: Secondary | ICD-10-CM | POA: Diagnosis not present

## 2022-07-11 DIAGNOSIS — R531 Weakness: Secondary | ICD-10-CM | POA: Diagnosis not present

## 2022-07-11 DIAGNOSIS — W19XXXA Unspecified fall, initial encounter: Secondary | ICD-10-CM | POA: Diagnosis not present

## 2022-07-23 DIAGNOSIS — R293 Abnormal posture: Secondary | ICD-10-CM | POA: Diagnosis not present

## 2022-07-23 DIAGNOSIS — M2569 Stiffness of other specified joint, not elsewhere classified: Secondary | ICD-10-CM | POA: Diagnosis not present

## 2022-07-23 DIAGNOSIS — M6281 Muscle weakness (generalized): Secondary | ICD-10-CM | POA: Diagnosis not present

## 2022-07-23 DIAGNOSIS — R5381 Other malaise: Secondary | ICD-10-CM | POA: Diagnosis not present

## 2022-07-24 DIAGNOSIS — B351 Tinea unguium: Secondary | ICD-10-CM | POA: Diagnosis not present

## 2022-07-24 DIAGNOSIS — L600 Ingrowing nail: Secondary | ICD-10-CM | POA: Diagnosis not present

## 2022-07-24 DIAGNOSIS — M79674 Pain in right toe(s): Secondary | ICD-10-CM | POA: Diagnosis not present

## 2022-07-24 DIAGNOSIS — M79675 Pain in left toe(s): Secondary | ICD-10-CM | POA: Diagnosis not present

## 2022-07-30 DIAGNOSIS — M6281 Muscle weakness (generalized): Secondary | ICD-10-CM | POA: Diagnosis not present

## 2022-07-30 DIAGNOSIS — M2569 Stiffness of other specified joint, not elsewhere classified: Secondary | ICD-10-CM | POA: Diagnosis not present

## 2022-07-30 DIAGNOSIS — R293 Abnormal posture: Secondary | ICD-10-CM | POA: Diagnosis not present

## 2022-07-30 DIAGNOSIS — R5381 Other malaise: Secondary | ICD-10-CM | POA: Diagnosis not present

## 2022-08-02 DIAGNOSIS — Z23 Encounter for immunization: Secondary | ICD-10-CM | POA: Diagnosis not present

## 2022-08-07 DIAGNOSIS — M2569 Stiffness of other specified joint, not elsewhere classified: Secondary | ICD-10-CM | POA: Diagnosis not present

## 2022-08-07 DIAGNOSIS — R293 Abnormal posture: Secondary | ICD-10-CM | POA: Diagnosis not present

## 2022-08-07 DIAGNOSIS — M6281 Muscle weakness (generalized): Secondary | ICD-10-CM | POA: Diagnosis not present

## 2022-08-07 DIAGNOSIS — R5381 Other malaise: Secondary | ICD-10-CM | POA: Diagnosis not present

## 2022-08-15 DIAGNOSIS — M2569 Stiffness of other specified joint, not elsewhere classified: Secondary | ICD-10-CM | POA: Diagnosis not present

## 2022-08-15 DIAGNOSIS — M6281 Muscle weakness (generalized): Secondary | ICD-10-CM | POA: Diagnosis not present

## 2022-08-15 DIAGNOSIS — R5381 Other malaise: Secondary | ICD-10-CM | POA: Diagnosis not present

## 2022-08-15 DIAGNOSIS — R293 Abnormal posture: Secondary | ICD-10-CM | POA: Diagnosis not present

## 2022-08-17 DIAGNOSIS — R5381 Other malaise: Secondary | ICD-10-CM | POA: Diagnosis not present

## 2022-08-17 DIAGNOSIS — M2569 Stiffness of other specified joint, not elsewhere classified: Secondary | ICD-10-CM | POA: Diagnosis not present

## 2022-08-17 DIAGNOSIS — M6281 Muscle weakness (generalized): Secondary | ICD-10-CM | POA: Diagnosis not present

## 2022-08-17 DIAGNOSIS — R293 Abnormal posture: Secondary | ICD-10-CM | POA: Diagnosis not present

## 2022-08-20 DIAGNOSIS — L600 Ingrowing nail: Secondary | ICD-10-CM | POA: Diagnosis not present

## 2022-08-20 DIAGNOSIS — M79675 Pain in left toe(s): Secondary | ICD-10-CM | POA: Diagnosis not present

## 2022-08-20 DIAGNOSIS — B351 Tinea unguium: Secondary | ICD-10-CM | POA: Diagnosis not present

## 2022-08-20 DIAGNOSIS — M79674 Pain in right toe(s): Secondary | ICD-10-CM | POA: Diagnosis not present

## 2022-08-22 DIAGNOSIS — R293 Abnormal posture: Secondary | ICD-10-CM | POA: Diagnosis not present

## 2022-08-22 DIAGNOSIS — M6281 Muscle weakness (generalized): Secondary | ICD-10-CM | POA: Diagnosis not present

## 2022-08-22 DIAGNOSIS — M2569 Stiffness of other specified joint, not elsewhere classified: Secondary | ICD-10-CM | POA: Diagnosis not present

## 2022-08-22 DIAGNOSIS — R5381 Other malaise: Secondary | ICD-10-CM | POA: Diagnosis not present

## 2022-08-24 DIAGNOSIS — M2569 Stiffness of other specified joint, not elsewhere classified: Secondary | ICD-10-CM | POA: Diagnosis not present

## 2022-08-24 DIAGNOSIS — R293 Abnormal posture: Secondary | ICD-10-CM | POA: Diagnosis not present

## 2022-08-24 DIAGNOSIS — R5381 Other malaise: Secondary | ICD-10-CM | POA: Diagnosis not present

## 2022-08-24 DIAGNOSIS — M6281 Muscle weakness (generalized): Secondary | ICD-10-CM | POA: Diagnosis not present

## 2022-08-29 DIAGNOSIS — M2569 Stiffness of other specified joint, not elsewhere classified: Secondary | ICD-10-CM | POA: Diagnosis not present

## 2022-08-29 DIAGNOSIS — R5381 Other malaise: Secondary | ICD-10-CM | POA: Diagnosis not present

## 2022-08-29 DIAGNOSIS — M6281 Muscle weakness (generalized): Secondary | ICD-10-CM | POA: Diagnosis not present

## 2022-08-29 DIAGNOSIS — R293 Abnormal posture: Secondary | ICD-10-CM | POA: Diagnosis not present

## 2022-09-03 DIAGNOSIS — M2569 Stiffness of other specified joint, not elsewhere classified: Secondary | ICD-10-CM | POA: Diagnosis not present

## 2022-09-03 DIAGNOSIS — R5381 Other malaise: Secondary | ICD-10-CM | POA: Diagnosis not present

## 2022-09-03 DIAGNOSIS — R293 Abnormal posture: Secondary | ICD-10-CM | POA: Diagnosis not present

## 2022-09-03 DIAGNOSIS — M6281 Muscle weakness (generalized): Secondary | ICD-10-CM | POA: Diagnosis not present

## 2022-09-06 DIAGNOSIS — M6281 Muscle weakness (generalized): Secondary | ICD-10-CM | POA: Diagnosis not present

## 2022-09-06 DIAGNOSIS — M2569 Stiffness of other specified joint, not elsewhere classified: Secondary | ICD-10-CM | POA: Diagnosis not present

## 2022-09-06 DIAGNOSIS — R5381 Other malaise: Secondary | ICD-10-CM | POA: Diagnosis not present

## 2022-09-06 DIAGNOSIS — R293 Abnormal posture: Secondary | ICD-10-CM | POA: Diagnosis not present

## 2022-09-07 DIAGNOSIS — R5381 Other malaise: Secondary | ICD-10-CM | POA: Diagnosis not present

## 2022-09-07 DIAGNOSIS — M2569 Stiffness of other specified joint, not elsewhere classified: Secondary | ICD-10-CM | POA: Diagnosis not present

## 2022-09-07 DIAGNOSIS — R293 Abnormal posture: Secondary | ICD-10-CM | POA: Diagnosis not present

## 2022-09-07 DIAGNOSIS — M6281 Muscle weakness (generalized): Secondary | ICD-10-CM | POA: Diagnosis not present

## 2022-09-12 DIAGNOSIS — M6281 Muscle weakness (generalized): Secondary | ICD-10-CM | POA: Diagnosis not present

## 2022-09-12 DIAGNOSIS — R5381 Other malaise: Secondary | ICD-10-CM | POA: Diagnosis not present

## 2022-09-12 DIAGNOSIS — R293 Abnormal posture: Secondary | ICD-10-CM | POA: Diagnosis not present

## 2022-09-12 DIAGNOSIS — M2569 Stiffness of other specified joint, not elsewhere classified: Secondary | ICD-10-CM | POA: Diagnosis not present

## 2022-09-14 DIAGNOSIS — M2569 Stiffness of other specified joint, not elsewhere classified: Secondary | ICD-10-CM | POA: Diagnosis not present

## 2022-09-14 DIAGNOSIS — R5381 Other malaise: Secondary | ICD-10-CM | POA: Diagnosis not present

## 2022-09-14 DIAGNOSIS — R293 Abnormal posture: Secondary | ICD-10-CM | POA: Diagnosis not present

## 2022-09-14 DIAGNOSIS — M6281 Muscle weakness (generalized): Secondary | ICD-10-CM | POA: Diagnosis not present

## 2022-09-18 DIAGNOSIS — M79675 Pain in left toe(s): Secondary | ICD-10-CM | POA: Diagnosis not present

## 2022-09-18 DIAGNOSIS — L600 Ingrowing nail: Secondary | ICD-10-CM | POA: Diagnosis not present

## 2022-09-18 DIAGNOSIS — M79674 Pain in right toe(s): Secondary | ICD-10-CM | POA: Diagnosis not present

## 2022-09-18 DIAGNOSIS — B351 Tinea unguium: Secondary | ICD-10-CM | POA: Diagnosis not present

## 2022-10-03 DIAGNOSIS — M2569 Stiffness of other specified joint, not elsewhere classified: Secondary | ICD-10-CM | POA: Diagnosis not present

## 2022-10-03 DIAGNOSIS — M6281 Muscle weakness (generalized): Secondary | ICD-10-CM | POA: Diagnosis not present

## 2022-10-03 DIAGNOSIS — R293 Abnormal posture: Secondary | ICD-10-CM | POA: Diagnosis not present

## 2022-10-03 DIAGNOSIS — R5381 Other malaise: Secondary | ICD-10-CM | POA: Diagnosis not present

## 2022-10-05 DIAGNOSIS — R293 Abnormal posture: Secondary | ICD-10-CM | POA: Diagnosis not present

## 2022-10-05 DIAGNOSIS — M6281 Muscle weakness (generalized): Secondary | ICD-10-CM | POA: Diagnosis not present

## 2022-10-05 DIAGNOSIS — M2569 Stiffness of other specified joint, not elsewhere classified: Secondary | ICD-10-CM | POA: Diagnosis not present

## 2022-10-05 DIAGNOSIS — R5381 Other malaise: Secondary | ICD-10-CM | POA: Diagnosis not present

## 2022-10-10 DIAGNOSIS — M2569 Stiffness of other specified joint, not elsewhere classified: Secondary | ICD-10-CM | POA: Diagnosis not present

## 2022-10-10 DIAGNOSIS — R293 Abnormal posture: Secondary | ICD-10-CM | POA: Diagnosis not present

## 2022-10-10 DIAGNOSIS — R5381 Other malaise: Secondary | ICD-10-CM | POA: Diagnosis not present

## 2022-10-10 DIAGNOSIS — M6281 Muscle weakness (generalized): Secondary | ICD-10-CM | POA: Diagnosis not present

## 2024-01-20 ENCOUNTER — Other Ambulatory Visit: Payer: Self-pay | Admitting: Family Medicine

## 2024-01-20 DIAGNOSIS — R269 Unspecified abnormalities of gait and mobility: Secondary | ICD-10-CM

## 2024-01-23 ENCOUNTER — Inpatient Hospital Stay: Admission: RE | Admit: 2024-01-23 | Payer: Self-pay | Source: Ambulatory Visit

## 2024-01-31 ENCOUNTER — Ambulatory Visit
Admission: RE | Admit: 2024-01-31 | Discharge: 2024-01-31 | Disposition: A | Discharge status: Home / Self Care | Source: Ambulatory Visit | Attending: Family Medicine | Admitting: Family Medicine

## 2024-01-31 DIAGNOSIS — R269 Unspecified abnormalities of gait and mobility: Secondary | ICD-10-CM

## 2024-04-09 ENCOUNTER — Ambulatory Visit: Admitting: Podiatry

## 2024-04-14 ENCOUNTER — Encounter: Payer: Self-pay | Admitting: Podiatry

## 2024-04-14 ENCOUNTER — Ambulatory Visit: Admitting: Podiatry

## 2024-04-14 DIAGNOSIS — M722 Plantar fascial fibromatosis: Secondary | ICD-10-CM

## 2024-04-14 DIAGNOSIS — L600 Ingrowing nail: Secondary | ICD-10-CM | POA: Diagnosis not present

## 2024-04-14 NOTE — Progress Notes (Unsigned)
 Patient presents with complaint of pain at the medial and lateral borders of the hallux nail right.  She is known to have a Whipple for several years and permanent solution for her.  Also complains of foot.  She has had chronic past.  Says she needs new orthotics.   Physical exam:  General appearance: Pleasant, and in no acute distress. AOx3.  Vascular: Pedal pulses: DP 2/4 bilaterally, PT 2/4 bilaterally.  Minimal edema lower legs bilaterally. Capillary fill time immediate.  Neurological: Light touch intact feet bilaterally.  Normal Achilles reflex bilaterally.  No clonus or spasticity noted.  Negative Tinel's sign tarsal tunnel and porta bilaterally  Dermatologic:   Ingrown incurvated medial and lateral nail border hallux nail right with redness and swelling along the nail folds.  No drainage noted.  No signs of.  Tenderness with pressure on the nail folds.  Skin normal temperature bilaterally.  Skin normal color, tone, and texture bilaterally.   Musculoskeletal: Tenderness middle one third of the plantar fascial band bilaterally.  No tenderness lateral compression of calcaneus.  No fibromas noted.  Tenderness hallux distal dorsal right   Diagnosis: 1.  Ingrown nail hallux right both borders. 2.  Plantar fasciitis bilaterally.  Plan: -Established office visit for evaluation level 3. - Discussed with her the ingrown nails recommended matrixectomy medial lateral nail borders on the hallux right.  Discussed pros and cons of this.  Discussed procedure less involved in it. - Discussed plantar fasciitis discussed proper shoes we will get her set up with for orthotics.    Return 1 week matrixectomy medial lateral nail borders hallux right

## 2024-04-21 ENCOUNTER — Ambulatory Visit: Admitting: Podiatry

## 2024-04-21 DIAGNOSIS — L600 Ingrowing nail: Secondary | ICD-10-CM

## 2024-04-21 NOTE — Patient Instructions (Signed)

## 2024-04-21 NOTE — Progress Notes (Signed)
 Patient complains of painful ingrown both border(s) toe hallux right. Patient denies fevers, chills, nausea, vomiting.  Objective:  Vitals: Reviewed  General: Well developed, nourished, in no acute distress, alert and oriented x3   Vascular: DP pulse 2/4 bilateral. PT pulse 2/4 bilateral.  Minimal edema  Dermatology: Erythema, edema, incurvated nail border medial and lateral with no drainage . Tenderness present with palpation nail borders and nail folds hallux right normal skin tone and texture feet with normal hair growth.  Neurological: Grossly intact. Normal reflexes.   Musculoskeletal: Tenderness with palpation of the distal hallux right. No tenderness or painful ROM at IPJ.  Diagnosis: Ingrown nail both borders hallux right  Plan: -discussed etiology and treatment of ingrown nails. Discussed surgical vs conservative treatment. -Consent signed for appropriate matrixectomy affected nail(s).  Procedure(s):   - Matrixectomy(s) both borders hallux right: Toe anesthetized with 3cc 2:1 mixture 2% Lidocaine with epinephrine: Sodium Bicarbonate. Surgical site prepped. Digital tourniquet applied.  Avulsion of nail borders. performed. Matrixecomy performed with three 30 second applications of phenol to nail matrix. Site irrigated with alcohol.  Tourniquet released with good vascularity noticed in digit.  Applied triple antibiotic to nailbed and applied gauze and Coban dressing. - Written and oral postoperative instructions given.  -Return for post-op 2 weeks.  Baker Bon, DPM

## 2024-05-01 ENCOUNTER — Ambulatory Visit: Admitting: Neurology

## 2024-05-01 ENCOUNTER — Encounter: Payer: Self-pay | Admitting: Neurology

## 2024-05-01 VITALS — BP 134/84 | HR 86 | Ht 62.5 in | Wt 125.0 lb

## 2024-05-01 DIAGNOSIS — R29818 Other symptoms and signs involving the nervous system: Secondary | ICD-10-CM

## 2024-05-01 DIAGNOSIS — R296 Repeated falls: Secondary | ICD-10-CM

## 2024-05-01 DIAGNOSIS — R251 Tremor, unspecified: Secondary | ICD-10-CM | POA: Diagnosis not present

## 2024-05-01 DIAGNOSIS — R269 Unspecified abnormalities of gait and mobility: Secondary | ICD-10-CM | POA: Diagnosis not present
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
## 2024-05-01 NOTE — Patient Instructions (Addendum)
 Check thyroid  today  Brain DaTscan How to prepare and what to expect What is a brain DaTscan? A brain DaTscan is a nuclear medicine scan. It uses radioactive material to diagnose some diseases of the brain, especially those that cause tremor (shakiness). DaTscan is a brand name for a drug called ioflupane I-123. A brain DaTscan is a form of radiology, because radiation is used to take pictures of the body. This radioactive drug is ordered especially for you. Because of this, we need at least 72 hours' notice if you must cancel or reschedule your scan.   How does the scan work? You will be given a small dose of tracer (radioactive material) through an intravenous (IV) line. This tracer will collect in part of your brain and give off gamma rays. A special camera called a gamma camera will use these rays to produce pictures and measurements of your brain. How do I prepare? Some drugs will affect the results of your brain DaTscan. You will need to stop taking these drugs before your scan. The table on page 2 lists the drugs that need to be stopped, and for how many days before your scan. This list is in alphabetical order by the generic name of the drug. The common brand names are listed beneath the generic name. Please confirm these instructions with your doctor who prescribed the drug. Drugs to Stop Taking Before your scan, stop taking these medicines for the length of time shown: Name of Drug Stop Taking  Amoxapine 4 days before  Benztropine  Cogentin 3 days before  Bupropion (Aplenzin, Budeprion, Voxra, Wellbutrin, Zyban) 48 hours before  Buspirone 15 hours before  Citalopram 24 hours before  Cocaine 6 hours before  Escitalopram 24 hours before  Methamphetamine 24 hours before  Methylphenidate (Concerta, Metadate, Methylin, Ritalin) 20 hours before  Paroxetine 24 hours before  Selegilene 48 hours before  Sertraline 3 days before  If you are breastfeeding, or if there is any chance you  are pregnant, please tell the scheduler or technologist (the person who will help you prepare for your scan). How is the scan done? When you first arrive, we will ask you to drink a small cup of water with potassium iodine in it. This water may have a metallic taste.  An hour after you drink the potassium iodine water, the technologist will inject a small amount of tracer into a vein in your arm or hand through your IV.  You must stay in the department for 30 minutes after the injection.  You will then have a break for 3 hours. It is OK to eat and drink during this break.  You must return to the clinic after this 3-hour break to have images of your brain taken.  Then, 4 hours after you receive your tracer injection, the technologist will take images of your brain with the gamma camera. You will lie flat on the exam table while these images are being taken.  You must not move while the camera is taking pictures. If you move, the pictures will be blurry and may have to be taken again.  Taking the images will take 40 to 45 minutes. Your total time in the imaging room will be about 1 hour.  You may also have a low-dose CT scan of your brain to help confirm any results. A CT scan is another way to take images inside your body.  It will take about 5 hours from the time you drink the potassium iodine water  until the scans are complete. What will I feel during the scan? The technologist will help make you as comfortable as possible on the exam table for the scan.  You may feel some minor discomfort from the IV.  Lying still on the exam table may be hard for some patients.  The camera will be close to your head. This may make you feel confined or uneasy (claustrophobic). Please tell the doctor who referred you for this scan if you know you are claustrophobic. Are there any side effects from the scan? Most of the radioactivity from the tracer will pass out of your body in your urine or stool. The rest  simply goes away over time.  Bad reactions to this scan are very rare. Fewer than 1% of patients (fewer than 1 out of 100) have a bad reaction. Reactions may include headache, nausea, vertigo (dizziness), or dry mouth. How do I get the results? When the test is over, the nuclear medicine doctor will review your images, prepare a written report, and talk with your doctor about the results. Your doctor will then talk with you about the results and your treatment options. If you needed to stop taking any medicines on the day of your scan, ask your doctor when to start taking them again.  The potentially interfering drugs consist of: amoxapine, amphetamine, benztropine, bupropion, buspirone, citalopram, cocaine, mazindol, methamphetamine, methylphenidate, norephedrine, phentermine, escitalopram,  phenylpropanolamine, selegiline, paroxetine, and sertraline

## 2024-05-02 LAB — CBC WITH DIFFERENTIAL/PLATELET
Basophils Absolute: 0.1 10*3/uL (ref 0.0–0.2)
Basos: 1 %
EOS (ABSOLUTE): 0.2 10*3/uL (ref 0.0–0.4)
Eos: 2 %
Hematocrit: 44.2 % (ref 34.0–46.6)
Hemoglobin: 14.4 g/dL (ref 11.1–15.9)
Immature Grans (Abs): 0 10*3/uL (ref 0.0–0.1)
Immature Granulocytes: 0 %
Lymphocytes Absolute: 1.9 10*3/uL (ref 0.7–3.1)
Lymphs: 25 %
MCH: 32.2 pg (ref 26.6–33.0)
MCHC: 32.6 g/dL (ref 31.5–35.7)
MCV: 99 fL — ABNORMAL HIGH (ref 79–97)
Monocytes Absolute: 0.7 10*3/uL (ref 0.1–0.9)
Monocytes: 9 %
Neutrophils Absolute: 4.9 10*3/uL (ref 1.4–7.0)
Neutrophils: 63 %
Platelets: 302 10*3/uL (ref 150–450)
RBC: 4.47 x10E6/uL (ref 3.77–5.28)
RDW: 11.9 % (ref 11.7–15.4)
WBC: 7.8 10*3/uL (ref 3.4–10.8)

## 2024-05-02 LAB — COMPREHENSIVE METABOLIC PANEL WITH GFR
ALT: 21 IU/L (ref 0–32)
AST: 34 IU/L (ref 0–40)
Albumin: 4.5 g/dL (ref 3.8–4.8)
Alkaline Phosphatase: 122 IU/L — ABNORMAL HIGH (ref 44–121)
BUN/Creatinine Ratio: 22 (ref 12–28)
BUN: 16 mg/dL (ref 8–27)
Bilirubin Total: 1 mg/dL (ref 0.0–1.2)
CO2: 21 mmol/L (ref 20–29)
Calcium: 9.6 mg/dL (ref 8.7–10.3)
Chloride: 100 mmol/L (ref 96–106)
Creatinine, Ser: 0.73 mg/dL (ref 0.57–1.00)
Globulin, Total: 2.7 g/dL (ref 1.5–4.5)
Glucose: 82 mg/dL (ref 70–99)
Potassium: 4.4 mmol/L (ref 3.5–5.2)
Sodium: 137 mmol/L (ref 134–144)
Total Protein: 7.2 g/dL (ref 6.0–8.5)
eGFR: 85 mL/min/{1.73_m2} (ref >59)

## 2024-05-02 LAB — TSH RFX ON ABNORMAL TO FREE T4: TSH: 2.67 u[IU]/mL (ref 0.450–4.500)

## 2024-05-05 ENCOUNTER — Ambulatory Visit: Payer: Self-pay | Admitting: Neurology

## 2024-05-06 ENCOUNTER — Ambulatory Visit: Admitting: Podiatry

## 2024-05-06 ENCOUNTER — Encounter: Payer: Self-pay | Admitting: Podiatry

## 2024-05-06 DIAGNOSIS — B351 Tinea unguium: Secondary | ICD-10-CM | POA: Diagnosis not present

## 2024-05-06 DIAGNOSIS — M79672 Pain in left foot: Secondary | ICD-10-CM | POA: Diagnosis not present

## 2024-05-06 DIAGNOSIS — M79671 Pain in right foot: Secondary | ICD-10-CM

## 2024-05-06 MED ORDER — EFINACONAZOLE 10 % EX SOLN: 1.0000 [drp] | Freq: Every day | CUTANEOUS | 9 refills | Status: AC

## 2024-05-06 NOTE — Progress Notes (Signed)
 Patient presents for evaluation and treatment of tenderness and some redness around nails feet.  Tenderness around toes with walking and wearing shoes.  Physical exam:  General appearance: Alert, pleasant, and in no acute distress.  Vascular: Pedal pulses: DP 2/4 B/L, PT 2/4 B/L. Mild edema lower legs bilaterally  Neurological:  Grossly intact bilaterally  Dermatologic:  Nails thickened, disfigured, discolored 1-5 BL with subungual debris.  Redness and hypertrophic nail folds along nail folds bilaterally but no signs of drainage or infection.  Nail surgery site healing well with just a little bit of crusting and some slight amount of serous drainage.  No signs of infection.  Musculoskeletal:  Some  tenderness distal aspect of toes bilaterally   Diagnosis: 1. Painful onychomycotic nails 1 through 5 bilaterally. 2. Pain toes 1 through 5 bilaterally.  Plan: -Established office visit for evaluation and management. - Discussed options for treatment of onychomycosis on the nails 1 through 5 bilaterally.  Discussed topical versus oral treatment.  She would like to try topical treatment first before oral Lamisil. -Discussed preventative measures she can do regarding shoes and socks to prevent onychomycosis. - Rx Jublia, applied to nails daily.  Refills x 9  Return as needed

## 2024-05-26 ENCOUNTER — Encounter (HOSPITAL_COMMUNITY)
Admission: RE | Admit: 2024-05-26 | Discharge: 2024-05-26 | Disposition: A | Discharge status: Home / Self Care | Source: Ambulatory Visit | Attending: Neurology | Admitting: Neurology

## 2024-05-26 DIAGNOSIS — R296 Repeated falls: Secondary | ICD-10-CM | POA: Insufficient documentation

## 2024-05-26 DIAGNOSIS — R269 Unspecified abnormalities of gait and mobility: Secondary | ICD-10-CM | POA: Diagnosis present

## 2024-05-26 DIAGNOSIS — R29818 Other symptoms and signs involving the nervous system: Secondary | ICD-10-CM | POA: Diagnosis present

## 2024-05-26 DIAGNOSIS — R251 Tremor, unspecified: Secondary | ICD-10-CM | POA: Diagnosis present

## 2024-05-26 MED ORDER — POTASSIUM IODIDE (ANTIDOTE) 130 MG PO TABS
ORAL_TABLET | ORAL | Status: AC
  Filled 2024-05-26: qty 1

## 2024-05-26 MED ORDER — IOFLUPANE I 123 185 MBQ/2.5ML IV SOLN
4.4600 | Freq: Once | INTRAVENOUS | Status: AC | PRN
  Administered 2024-05-26: 4.46 via INTRAVENOUS

## 2024-06-05 ENCOUNTER — Telehealth: Payer: Self-pay | Admitting: Neurology

## 2024-06-05 NOTE — Telephone Encounter (Signed)
    Called pt at [phone]. LVM relaying results above. Asked her to call back if she has any further questions.

## 2024-06-05 NOTE — Telephone Encounter (Signed)
 Pt asking for results to recent scan

## 2024-07-21 ENCOUNTER — Emergency Department (HOSPITAL_COMMUNITY)
Admission: EM | Admit: 2024-07-21 | Discharge: 2024-07-21 | Disposition: A | Discharge status: Home / Self Care | Attending: Emergency Medicine | Admitting: Emergency Medicine

## 2024-07-21 ENCOUNTER — Emergency Department (HOSPITAL_COMMUNITY)

## 2024-07-21 ENCOUNTER — Other Ambulatory Visit: Payer: Self-pay

## 2024-07-21 DIAGNOSIS — E039 Hypothyroidism, unspecified: Secondary | ICD-10-CM | POA: Insufficient documentation

## 2024-07-21 DIAGNOSIS — I10 Essential (primary) hypertension: Secondary | ICD-10-CM | POA: Insufficient documentation

## 2024-07-21 DIAGNOSIS — Y92410 Unspecified street and highway as the place of occurrence of the external cause: Secondary | ICD-10-CM | POA: Insufficient documentation

## 2024-07-21 DIAGNOSIS — M542 Cervicalgia: Secondary | ICD-10-CM | POA: Diagnosis not present

## 2024-07-21 DIAGNOSIS — S0990XA Unspecified injury of head, initial encounter: Secondary | ICD-10-CM | POA: Diagnosis present

## 2024-07-21 DIAGNOSIS — Z79899 Other long term (current) drug therapy: Secondary | ICD-10-CM | POA: Insufficient documentation

## 2024-07-21 MED ORDER — ACETAMINOPHEN 325 MG PO TABS
650.0000 mg | ORAL_TABLET | Freq: Once | ORAL | Status: AC
  Administered 2024-07-21: 650 mg via ORAL
  Filled 2024-07-21: qty 2

## 2024-07-21 MED ORDER — LIDOCAINE 5 % EX PTCH: 1.0000 | MEDICATED_PATCH | CUTANEOUS | 0 refills | Status: AC

## 2024-07-21 NOTE — ED Notes (Signed)
 C-collar removed with permission from PA.
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
## 2024-07-21 NOTE — Discharge Instructions (Addendum)
 Follow-up with your primary care provider.  Seek emergency care if experiencing any new or worsening symptoms.  Alternating between 650 mg Tylenol  and 400 mg Advil : The best way to alternate taking Acetaminophen  (example Tylenol ) and Ibuprofen  (example Advil /Motrin ) is to take them 3 hours apart. For example, if you take ibuprofen  at 6 am you can then take Tylenol  at 9 am. You can continue this regimen throughout the day, making sure you do not exceed the recommended maximum dose for each drug.
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
## 2024-07-23 ENCOUNTER — Telehealth: Payer: Self-pay | Admitting: Neurology

## 2024-07-23 NOTE — Telephone Encounter (Signed)
 Pt was seen in ED/ tylenol  and lidocaine  patches recommended.  CT negative.  Went to [employer] in clinic due to pcp out.  We have seen her for memory / tremors.  She will need referral from pcp is needs to see Neuro.
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
## 2024-07-27 ENCOUNTER — Emergency Department (HOSPITAL_COMMUNITY)
Admission: EM | Admit: 2024-07-27 | Discharge: 2024-07-27 | Discharge status: Against Medical Advice | Attending: Emergency Medicine | Admitting: Emergency Medicine

## 2024-07-27 ENCOUNTER — Other Ambulatory Visit: Payer: Self-pay

## 2024-07-27 ENCOUNTER — Emergency Department (HOSPITAL_COMMUNITY)

## 2024-07-27 ENCOUNTER — Encounter (HOSPITAL_COMMUNITY): Payer: Self-pay

## 2024-07-27 DIAGNOSIS — M542 Cervicalgia: Secondary | ICD-10-CM | POA: Diagnosis present

## 2024-07-27 DIAGNOSIS — M546 Pain in thoracic spine: Secondary | ICD-10-CM | POA: Diagnosis not present

## 2024-07-27 DIAGNOSIS — Y92410 Unspecified street and highway as the place of occurrence of the external cause: Secondary | ICD-10-CM | POA: Diagnosis not present

## 2024-07-27 DIAGNOSIS — Z5321 Procedure and treatment not carried out due to patient leaving prior to being seen by health care provider: Secondary | ICD-10-CM | POA: Diagnosis not present

## 2024-07-27 NOTE — ED Notes (Signed)
 Pt called x 3 with no response. Not visualized in the waiting area or restroom.
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
## 2024-09-02 ENCOUNTER — Ambulatory Visit: Admitting: Podiatry

## 2024-09-02 DIAGNOSIS — M722 Plantar fascial fibromatosis: Secondary | ICD-10-CM | POA: Diagnosis not present

## 2024-09-02 NOTE — Progress Notes (Signed)
 Patient presents today to be casted for custom molded orthotics. Dr. Norleen Binder has been treating patient for Plantar Fibromatosis.   Impression foam cast was taken. ABN signed.  Patient info-  Shoe size: 9  Shoe style: sneakers   Height: 5'2  Weight: 123  Insurance: [employer]   Patient will be notified once orthotics arrive in office and reappoint for fitting at that time.

## 2024-10-05 ENCOUNTER — Encounter

## 2024-10-06 ENCOUNTER — Other Ambulatory Visit

## 2024-10-19 ENCOUNTER — Ambulatory Visit (INDEPENDENT_AMBULATORY_CARE_PROVIDER_SITE_OTHER): Admitting: Podiatrist

## 2024-10-19 DIAGNOSIS — M722 Plantar fascial fibromatosis: Secondary | ICD-10-CM

## 2024-10-19 NOTE — Progress Notes (Signed)
 ORTHOTIC DISPENSING:   Reason for Visit:         Fitting and Delivery of Custom Fabricated Foot Orthoses Patient Report:            Patient reports comfort and is satisfied with device.   OBJECTIVE DATA: Patient History / Diagnosis:    No change in pathology Provided Device:                     Functional foot orthoses   GOAL OF ORTHOSIS - Improve gait - Decrease energy expenditure - Improve Balance - Provide Triplanar stability of foot complex - Facilitate motion   ACTIONS PERFORMED Patient was fit with custom foot orthoses   Patient was provided with verbal and written instruction and demonstration regarding wear, care, proper fit, function, and use of the orthosis.    Patient was also provided with verbal instruction regarding how to report any failures or malfunctions of the orthosis and necessary follow up care. Patient was also instructed to contact our office regarding any change in status that may affect the function of the orthosis.   Patient demonstrated understanding of all instructions.  She also stated she is having trouble with her balance-  we briefly discussed a balance brace might be beneficial for her in the future.  If she is interested in pursuing at some point she will call.   Gisel Vipond, DPM
